# Patient Record
Sex: Female | Born: 1955 | Race: White | Hispanic: No | Marital: Married
Health system: Midwestern US, Academic
[De-identification: ages and names within clinical notes are randomized; demographics above are authoritative.]

---

## 2017-05-27 ENCOUNTER — Encounter: Admit: 2017-05-27 | Discharge: 2017-05-27 | Payer: BC Managed Care – PPO

## 2017-05-27 NOTE — Telephone Encounter
Pt called and lvm that she would like to see if she can schedule an appt for thumb injections.    Called pt and she stated that she would like to know if she can have her right CMC injected and possibly her left as well, but for sure her right.     Notified pt that this RN would check with Dr. Fannie Knee to see if this pt could be scheduled in her injection clinic for [facility] injections.

## 2017-05-28 NOTE — Telephone Encounter
Yes. I recommend right hand 1st CMC injection on one visit and left 1st CMC injection on separate visit, since it is best to have one hand to use during the 48 hours of recovery. Please schedule for 60 minute slot with Ultrasound on next available procedure clinic. If no 60 min slot available, OK to book for 30 minute slot.

## 2017-05-29 NOTE — Telephone Encounter
Called pt and offered appt times for injections.     Pt scheduled for Right CMC on 06/06/17 at 1pm for procedure 60.    Pt scheduled for left CMC on 06/13/17 at 330pm for procedure 60    Pt aware of location.    Pt had no further questions at this time.

## 2017-06-06 ENCOUNTER — Ambulatory Visit: Admit: 2017-06-06 | Discharge: 2017-06-07 | Payer: BC Managed Care – PPO

## 2017-06-06 ENCOUNTER — Encounter: Admit: 2017-06-06 | Discharge: 2017-06-06 | Payer: BC Managed Care – PPO

## 2017-06-06 DIAGNOSIS — E039 Hypothyroidism, unspecified: ICD-10-CM

## 2017-06-06 DIAGNOSIS — M1990 Unspecified osteoarthritis, unspecified site: ICD-10-CM

## 2017-06-06 DIAGNOSIS — M549 Dorsalgia, unspecified: ICD-10-CM

## 2017-06-06 DIAGNOSIS — N6091 Unspecified benign mammary dysplasia of right breast: Principal | ICD-10-CM

## 2017-06-06 MED ORDER — LIDOCAINE/METHYLPREDNISOLONE MIXTURE
Freq: Once | PERIARTICULAR | 0 refills | Status: CP
Start: 2017-06-06 — End: ?
  Administered 2017-06-06 (×2): 0.600 mL via PERIARTICULAR

## 2017-06-06 MED ORDER — LIDOCAINE (PF) 10 MG/ML (1 %) IJ SOLN
.5 mL | Freq: Once | INTRAMUSCULAR | 0 refills | Status: CP
Start: 2017-06-06 — End: ?
  Administered 2017-06-06: 19:00:00 0.5 mL via INTRAMUSCULAR

## 2017-06-07 DIAGNOSIS — M19041 Primary osteoarthritis, right hand: Principal | ICD-10-CM

## 2017-06-07 DIAGNOSIS — M19049 Primary osteoarthritis, unspecified hand: ICD-10-CM

## 2017-06-13 ENCOUNTER — Encounter: Admit: 2017-06-13 | Discharge: 2017-06-13 | Payer: BC Managed Care – PPO

## 2017-06-13 DIAGNOSIS — E039 Hypothyroidism, unspecified: ICD-10-CM

## 2017-06-13 DIAGNOSIS — N6091 Unspecified benign mammary dysplasia of right breast: Principal | ICD-10-CM

## 2017-06-13 DIAGNOSIS — M549 Dorsalgia, unspecified: ICD-10-CM

## 2017-06-13 DIAGNOSIS — M1990 Unspecified osteoarthritis, unspecified site: ICD-10-CM

## 2017-06-14 ENCOUNTER — Ambulatory Visit: Admit: 2017-06-13 | Discharge: 2017-06-14 | Payer: BC Managed Care – PPO

## 2017-06-14 DIAGNOSIS — M19049 Primary osteoarthritis, unspecified hand: ICD-10-CM

## 2017-06-14 DIAGNOSIS — M19042 Primary osteoarthritis, left hand: Principal | ICD-10-CM

## 2017-06-14 MED ORDER — LIDOCAINE (PF) 10 MG/ML (1 %) IJ SOLN
3 mL | Freq: Once | PERIARTICULAR | 0 refills | Status: CP
Start: 2017-06-14 — End: ?
  Administered 2017-06-13: 22:00:00 3 mL via PERIARTICULAR

## 2017-06-14 MED ORDER — LIDOCAINE/METHYLPREDNISOLONE MIXTURE
Freq: Once | PERIARTICULAR | 0 refills | Status: CP
Start: 2017-06-14 — End: ?
  Administered 2017-06-13 (×2): 0.600 mL via PERIARTICULAR

## 2017-08-05 ENCOUNTER — Ambulatory Visit: Admit: 2017-08-05 | Discharge: 2017-08-05 | Payer: BC Managed Care – PPO

## 2017-08-05 DIAGNOSIS — M1810 Unilateral primary osteoarthritis of first carpometacarpal joint, unspecified hand: ICD-10-CM

## 2017-08-05 DIAGNOSIS — M1189 Other specified crystal arthropathies, multiple sites: Principal | ICD-10-CM

## 2017-08-05 NOTE — Progress Notes
Date of Service: 08/05/2017    Subjective:             Miranda Cuevas is a 61 y.o. female.    History of Present Illness  Miranda Cuevas presents today with her husband to discuss her bilateral thumb pain.  She is right-hand dominant.  She has had left thumb base pain for many years and noticed the onset on the right side as well over the past couple of years.  She believes at this point the right side is more problematic.  She has tried splinting and anti-inflammatory medicines with minimal relief.  She also has multiple steroid injections in the past of both sides, which are effective, but have become less effective over time.  She most recently had injections in September and noticed these are already starting to wear off.  No history of trauma to her hands.  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  She is interested in additional treatment options beyond steroid injections.  Her pain is mostly centered around the thumb base and she notices difficulty with opening jars or other gripping type activities.  She is planning on retiring next year and would like this addressed prior to that time.  She does have her daughter's wedding in December and then a busy time of year in January so would prefer to wait until after that timeframe.    Past Medical History:   Diagnosis Date   ??? Arthritis    ??? Atypical ductal hyperplasia of right breast    ??? Back pain    ??? Hypothyroid      Past Surgical History:   Procedure Laterality Date   ??? KNEE SURGERY Left 1995    ARTHROSCOPY    ??? BUNIONECTOMY Left 2000   ??? COLONOSCOPY  2008   ??? THYROIDECTOMY  2012   ??? KNEE REPLACEMENT Right 08/2016   ??? FOOT SURGERY     ??? HX ARTHROSCOPIC SURGERY       Family History   Problem Relation Age of Onset   ??? Hypertension Mother    ??? High Cholesterol Mother    ??? Arthritis-osteo Mother    ??? Hypertension Father    ??? High Cholesterol Father    ??? Cancer Sister    ??? Asthma Sister Social History     Social History   ??? Marital status: Married     Spouse name: N/A   ??? Number of children: N/A   ??? Years of education: N/A     Social History Main Topics   ??? Smoking status: Never Smoker   ??? Smokeless tobacco: Never Used   ??? Alcohol use 1.8 - 3.0 oz/week     3 - 5 Cans of beer per week   ??? Drug use: No   ??? Sexual activity: Not on file     Other Topics Concern   ??? Not on file     Social History Narrative    THIS ENTRY IS PRELIMINARY DOCUMENTATION.                                  Review of Systems   HENT: Positive for tinnitus.    Eyes: Negative.    Respiratory: Negative.    Cardiovascular: Negative.    Gastrointestinal: Negative.    Endocrine: Negative.    Genitourinary: Negative.    Musculoskeletal: Positive  for arthralgias, neck pain and neck stiffness.   Skin: Negative.    Allergic/Immunologic: Negative.    Neurological: Negative.    Hematological: Negative.    Psychiatric/Behavioral: Negative.    All other systems reviewed and are negative.        Objective:         ??? BLACK COHOSH PO Take 40 mg by mouth twice daily.   ??? CALCIUM CARBONATE/VITAMIN D2 (CALCIUM + VITAMIN D PO) Take 2 Tabs by mouth daily.   ??? DOCOSAHEXANOIC ACID/EPA (FISH OIL PO) Take 1,200 mg by mouth daily.   ??? fluoxetine (PROZAC) 20 mg capsule Take 20 mg by mouth daily.   ??? KETOTIFEN FUMARATE (ZADITOR OP) Place  into or around eye(s).   ??? levothyroxine (SYNTHROID) 125 mcg tablet Take 100 mcg by mouth daily.   ??? NAPROXEN SODIUM (ALEVE PO) Take  by mouth daily.   ??? pravastatin (PRAVACHOL) 40 mg tablet Take 40 mg by mouth at bedtime daily.   ??? TRIAMCINOLONE ACETONIDE (NASACORT NA) Apply  into nose as directed daily.     Vitals:    08/05/17 1312   BP: 124/81   Pulse: 58   Weight: 87.5 kg (193 lb)   Height: 170.2 cm (67.01)     Body mass index is 30.22 kg/m???.     Physical Exam   Constitutional: She is oriented to person, place, and time. She appears well-developed and well-nourished.   HENT:   Head: Normocephalic and atraumatic. Eyes: Conjunctivae are normal. No scleral icterus.   Cardiovascular: Intact distal pulses.    Pulmonary/Chest: Effort normal.   Musculoskeletal:   Mild shoulder deformity bilateral thumb base.  Positive grind bilaterally.  10 degrees of MP hyperextension on the left.  None on the right.  Negative Finkelstein's bilaterally.  Mild STT tenderness as well as tenderness of the scaphoid tubercle on the right.  Neurovascular exam normal.   Neurological: She is alert and oriented to person, place, and time.   Skin: Skin is warm. Capillary refill takes less than 2 seconds.   Psychiatric: She has a normal mood and affect. Judgment normal.   Vitals reviewed.    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       Assessment and Plan:  Bilateral thumb basal joint arthritis.  Pathogenesis and treatment options were discussed.  She is largely exhausted nonoperative therapy given her extensive trials with anti-inflammatory both oral and topical as well as immobilization steroid injections.  We discussed the potential for trapeziectomy with tight rope suspension plasty.  The nature of the operation was discussed as well as the anticipated postoperative recovery time.  This point she is leaning toward starting with therapy on the left sometime in late January.  We can reinject her right side at that time as well.  She will contact her clinic to determine how she would like to proceed.  All questions answered.     D. Annabell Howells, MD  Assistant Professor of Plastic and Hand Surgery  [employer] [facility]  [phone]

## 2017-08-09 ENCOUNTER — Encounter: Admit: 2017-08-09 | Discharge: 2017-08-09 | Payer: BC Managed Care – PPO

## 2017-08-09 NOTE — Telephone Encounter
Confirmed sx date of 1/3 with the patient. Pre Op instructions have been sent to her email.

## 2017-08-13 ENCOUNTER — Encounter: Admit: 2017-08-13 | Discharge: 2017-08-13 | Payer: BC Managed Care – PPO

## 2017-08-13 DIAGNOSIS — M1810 Unilateral primary osteoarthritis of first carpometacarpal joint, unspecified hand: Principal | ICD-10-CM

## 2017-08-13 DIAGNOSIS — M1812 Unilateral primary osteoarthritis of first carpometacarpal joint, left hand: Principal | ICD-10-CM

## 2017-08-29 ENCOUNTER — Encounter: Admit: 2017-08-29 | Discharge: 2017-08-29 | Payer: BC Managed Care – PPO

## 2017-08-29 DIAGNOSIS — N644 Mastodynia: ICD-10-CM

## 2017-08-29 DIAGNOSIS — N6091 Unspecified benign mammary dysplasia of right breast: Principal | ICD-10-CM

## 2017-09-03 ENCOUNTER — Ambulatory Visit: Admit: 2017-09-03 | Discharge: 2017-09-04 | Payer: BC Managed Care – PPO

## 2017-09-03 ENCOUNTER — Encounter: Admit: 2017-09-03 | Discharge: 2017-09-03 | Payer: BC Managed Care – PPO

## 2017-09-03 DIAGNOSIS — E039 Hypothyroidism, unspecified: ICD-10-CM

## 2017-09-03 DIAGNOSIS — Z803 Family history of malignant neoplasm of breast: ICD-10-CM

## 2017-09-03 DIAGNOSIS — N644 Mastodynia: Principal | ICD-10-CM

## 2017-09-03 DIAGNOSIS — M1990 Unspecified osteoarthritis, unspecified site: ICD-10-CM

## 2017-09-03 DIAGNOSIS — N6091 Unspecified benign mammary dysplasia of right breast: ICD-10-CM

## 2017-09-03 DIAGNOSIS — M549 Dorsalgia, unspecified: ICD-10-CM

## 2017-09-03 NOTE — Progress Notes
Name: Miranda Cuevas          MRN: [id number]      DOB: 1956/07/10      AGE: 61 y.o.   DATE OF SERVICE: 09/03/2017           Reason for Visit:  Heme/Onc Care      Miranda Cuevas is a 61 y.o. female.     DIAGNOSIS:  Right breast ADH, dx 03/2014     History of Present Illness    Miranda Cuevas presents to clinic today for evaluation of right breast pain.  She reports right lateral chest wall discomfort and fullness that she has noticed in the last several weeks.  She reports that she does not feel any tenderness to touch or see any fullness in the mirror but rather notices it when lowering her right arm.  She reports that her sister was recently diagnosed with breast cancer and has made her more concerned.     HISTORY:  Miranda Cuevas is a Caucasian female who presented to the [facility] on 05/03/2014 at age 35 for evaluation of right breast Atypical ductal hyperplasia. Right stereotactic biopsy 04/08/14 ([place]) revealed Atypical ductal hyperplasia. Miranda Cuevas underwent right RSL lumpectomy on 06/10/14. Final pathology revealed fibrocystic changes with usual ductal hyperplasia. No evidence of malignancy.    BREAST IMAGING:  Mammogram:    -- Bilateral screening mammogram 04/06/14 ([place]) revealed heterogeneously dense breast tissue. There was a new cluster of microcalcifications in the upper outer right breast at approximately 9:30 measuring approximately 8-9 mm. The left breast appeared stable.   -- Right diagnostic mammogram 04/06/14 ([place]) revealed a 10 mm cluster of amorphous calcifications without evidence of associated mass or distortion.       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 No skin, nipple, or areolar change. Right - Post biopsy change at 10:00  REFERRED BY:  Dr. Steva Cuevas     Review of Systems  Constitutional: Negative for fever, chills, appetite change and fatigue.   HENT: Negative for hearing loss, congestion, rhinorrhea and tinnitus.    Eyes: Negative for pain, discharge and itching.   Respiratory: Negative for cough, chest tightness and shortness of breath.    Cardiovascular: Negative for chest pain and palpitations.   Gastrointestinal: Negative for abdominal distention, pain, nausea, vomiting, and diarrhea.   Genitourinary: Negative for frequency, vaginal bleeding, difficulty urinating and pelvic pain.   Musculoskeletal: Negative for myalgias, back pain, joint swelling and arthralgias.   Skin: Negative for rash.   Neurological: Negative for dizziness, weakness, light-headedness and headaches.   Hematological: Does not bruise/bleed easily.   Psychiatric/Behavioral: Negative for disturbed wake/sleep cycle. The patient is not nervous/anxious.     Allergies   Allergen Reactions   ??? Niacin FLUSHING (SKIN)   ??? Pcn [Penicillins] HIVES, RASH and ITCHING     PT STATES STAYED IN HOSPITAL FOR 1 WEEK D/T HIVES; patient tolerates cephalosporins     The following medical/surgical/family/social history and the list of medications are current, as of 09/03/2017    Past Medical  History:   Diagnosis Date   ??? Arthritis    ??? Atypical ductal hyperplasia of right breast    ??? Back pain    ??? Hypothyroid      Past Surgical History:   Procedure Laterality Date   ??? KNEE SURGERY Left 1995    ARTHROSCOPY    ??? BUNIONECTOMY Left 2000   ??? COLONOSCOPY  2008   ??? THYROIDECTOMY  2012   ??? KNEE REPLACEMENT Right 08/2016   ??? FOOT SURGERY     ??? HX ARTHROSCOPIC SURGERY       Family History   Problem Relation Age of Onset   ??? Hypertension Mother    ??? High Cholesterol Mother    ??? Arthritis-osteo Mother    ??? Hypertension Father ??? High Cholesterol Father    ??? Cancer Sister    ??? Asthma Sister      Social History     Social History   ??? Marital status: Married     Spouse name: N/A   ??? Number of children: N/A   ??? Years of education: N/A     Social History Main Topics   ??? Smoking status: Never Smoker   ??? Smokeless tobacco: Never Used   ??? Alcohol use 1.8 - 3.0 oz/week     3 - 5 Cans of beer per week   ??? Drug use: No   ??? Sexual activity: Not on file     Other Topics Concern   ??? Not on file     Social History Narrative    THIS ENTRY IS PRELIMINARY DOCUMENTATION.                           Objective:         ??? BLACK COHOSH PO Take 40 mg by mouth twice daily.   ??? CALCIUM CARBONATE/VITAMIN D2 (CALCIUM + VITAMIN D PO) Take 2 Tabs by mouth daily.   ??? DOCOSAHEXANOIC ACID/EPA (FISH OIL PO) Take 1,200 mg by mouth daily.   ??? fluoxetine (PROZAC) 20 mg capsule Take 20 mg by mouth daily.   ??? KETOTIFEN FUMARATE (ZADITOR OP) Place  into or around eye(s).   ??? levothyroxine (SYNTHROID) 125 mcg tablet Take 100 mcg by mouth daily.   ??? NAPROXEN SODIUM (ALEVE PO) Take  by mouth daily.   ??? pravastatin (PRAVACHOL) 40 mg tablet Take 40 mg by mouth at bedtime daily.   ??? TRIAMCINOLONE ACETONIDE (NASACORT NA) Apply  into nose as directed daily.     Vitals:    09/03/17 0931   BP: 135/79   Pulse: 92   Resp: 18   Temp: 36.5 ???C (97.7 ???F)   TempSrc: Oral   SpO2: 100%   Weight: 87.9 kg (193 lb 12.8 oz)   Height: 170.2 cm (67)     Body mass index is 30.35 kg/m???.             Pain Addressed:  N/A    Patient Evaluated for a Clinical Trial: No treatment clinical trial available for this patient.     [place] Cooperative Oncology Group performance status is 0, Fully active, able to carry on all pre-disease performance without restriction.Marland Kitchen     Physical Exam   Pulmonary/Chest:       Vitals reviewed.       RIGHT BREAST EXAM:  Breast:  No palpable breast masses. No skin, nipple, or areolar change. No asymmetry noted on exam  Skin Erythema:  No  Attachment of Overlying Skin:  No Peau d' orange:  No  Chest Wall Attachment:  No  Nipple Inversion:  No  Nipple Discharge: No    LEFT BREAST EXAM:  Breast: No palpable breast masses. No skin, nipple, or areolar change.  Skin Erythema:  No  Attachment of Overlying Skin:  No  Peau d' orange:  No  Chest Wall Attachment: No  Nipple Inversion:  No  Nipple Discharge:  No    RIGHT NODAL BASIN EXAM:  Axillary:  negative  Infraclavicular:  negative  Supraclavicular:  negative    LEFT NODAL BASIN EXAM:  Axillary:  negative  Infraclavicular: negative  Supraclavicular:  negative      Constitutional: No acute distress.  HEENT:  Head: Normocephalic and atraumatic.  Eyes: No discharge. No scleral icterus.  Pulmonary/Chest: No respiratory distress.   Neurological: Alert and oriented to person, place and time. No cranial nerve deficit.  Skin: Warm and dry. No rash noted. No erythema. No pallor.  Psychiatric: Normal mood and affect. Behavior is normal. Judgement and thought content normal.       Assessment and Plan:  Right breast ADH, dx 03/2014     Miranda Cuevas presents to clinic with her Husband today for evaluation of right lateral breast discomfort.  Miranda Cuevas was reassured that breast pain is a benign and self-limiting condition, the causes of which are not well understood. There is no effective, simple therapy, however some women have had success with trial of herbal supplements such as Fish farm manager or Flax Seed Oil.  In the absence of radiologic and physical exam findings, no further diagnostic intervention is recommended.  I recommended that she consider the Breast Cancer Prevention clinic for further follow up due to her risk factors and sister's new diagnosis of breast cancer.  She was open to this and would like a referral to [facility].  I will call with results of imaging when they are available.  Miranda Cuevas was given ample time to ask questions all of which were answered to her satisfaction. She was encouraged to call with any interval questions or concerns.     1. Mammogram and ultrasound today  2. Referral placed to [facility]    [name], PA-C         ADDENDUM 09/03/2017    Last mammogram was performed 8 months ago.  Reason for exam: clinical finding.  Indicated problem(s): right breast other indicated problem, rt   breast fullness/discomfort.    [id number] MAMMO DIAGNOSTIC RT/TOMO: RIGHT BREAST - September 03, 2017  - ACCESSION #: [id number]  2D/3D Procedure  3D Routine views.  2D Routine views.      Radiologist: Nicholos Johns, M.D.  Technologist: Patrina Levering  Heterogeneous tissue density is present. Patient describes   fullness within the lateral right breast. Postsurgical changes   are present of the right breast. No suspicious findings are   identified.    [id number] US BREAST TARGET RT: RIGHT BREAST - September 03, 2017 -   ACCESSION #: [id number]  Standard views.    Radiologists: Nicholos Johns, M.D.; Elfredia Nevins, M.D.  Technologist: Margaretmary Dys. Banks, Sonographer  Ultrasound imaging of the lateral breast at 8:00 area fullness   demonstrates normal breast tissue. No solid or cystic masses are   present.    By my electronic signature, I attest that I have personally   reviewed the images for this examination and formulated the   interpretations and opinions expressed in this report  Finalized by Nicholos Johns, M.D. on 09/03/2017 11:15 AM.   Dictated by Elfredia Nevins, M.D. on 09/03/2017 10:31 AM.      Electronically signed and approved by: Nicholos Johns, M.D.   [id number]      Impression       ACR BI-RADS??? Assessments: BIRAD 1-Negative (Overall)  Right breast MAMMO DIAG RT/T: BIRAD 1-negative.        RECOMMENDATION:  Routine screening mammogram of both breasts in 6 months.       Discussed results with Miranda Cuevas. I advised that order was placed for her to go to [facility] and that I will notify schedules to cancel her appointment with Dimas Alexandria, PA-C.  She was encouraged to call with any interval questions or concerns.     [name], PA-C

## 2017-09-12 ENCOUNTER — Encounter: Admit: 2017-09-12 | Discharge: 2017-09-12 | Payer: BC Managed Care – PPO

## 2017-09-12 DIAGNOSIS — N6091 Unspecified benign mammary dysplasia of right breast: Principal | ICD-10-CM

## 2017-09-12 DIAGNOSIS — M549 Dorsalgia, unspecified: ICD-10-CM

## 2017-09-12 DIAGNOSIS — E039 Hypothyroidism, unspecified: ICD-10-CM

## 2017-09-12 DIAGNOSIS — M1990 Unspecified osteoarthritis, unspecified site: ICD-10-CM

## 2017-09-12 NOTE — Pre-Anesthesia Patient Instructions
GENERAL INFORMATION    Before you come to the hospital  ??? Make arrangements for a responsible adult to drive you home and stay with you for 24 hours following surgery.  ??? Bath/Shower Instructions  ??? Take a bath or shower with antibacterial soap the night before or the morning of your procedure. Use clean towels.  ??? Put on clean clothes after bath or shower.  Avoid using lotion and oils.  ??? If you are having surgery above the waist, wear a shirt that fastens up the front.  ??? Sleep on clean sheets if bath or shower is done the night before procedure.  ??? Leave money, credit cards, jewelry, and any other valuables at home. The [facility] is not responsible for the loss or breakage of personal items.  ??? Remove nail polish, makeup and all jewelry (including piercings) before coming to the hospital.  ??? The morning of your procedure:  ??? brush your teeth and tongue  ??? do not smoke  ??? do not shave the area where you will have surgery    What to bring to the hospital  ??? ID/ Insurance Card  ??? Medical Device card  ??? Official documents for legal guardianship   ??? Copy of your Living Will, Advanced Directives, and/or Durable Power of Attorney   ??? Small bag with a few personal belongings  ??? Cases for glasses/hearing aids/contact lens (bring solutions for contacts)  ??? Dress in clean, loose, comfortable clothing     Eating or drinking before surgery  ??? Do not eat or drink anything after 11:00 p.m. the day before your procedure (including gum, mints, candy, or chewing tobacco) OR follow the specific instructions you were given by your Surgeon.  ??? You may have WATER ONLY up to 2 hours before arriving at the hospital.  ??? Other instructions: None     Other instructions  Notify your surgeon if:  ??? you become ill with a cough, fever, sore throat, nausea, vomiting or flu-like symptoms  ??? you have any open wounds/sores that are red, painful, draining, or are new since you last saw  the doctor ??? you need to cancel your procedure  ??? You will receive a call with your surgery arrival time from between 2:30pm and 4:30pm the last business day before your procedure.  If you do not receive a call, please call [phone] before 4:30pm or [phone] after 4:30pm.    Notify us at [facility]: [phone]  ??? if you need to cancel your procedure  ??? if you are going to be late    Arrival at the hospital    [facility]  [address]  [place], [place] [address]    ??? Park in the [employer], located directly across from the main entrance to the hospital.  ??? Valet parking is available  from 7 AM to 4 PM Monday through Friday.  ??? Enter through the ground floor main hospital entrance and check in at the Information Desk in the lobby.  ??? They will validate your parking ticket and direct you to the next location.  ??? If you are a woman between the ages of 38 and 59, and have not had a hysterectomy, you will be asked for a urine sample prior to surgery.  Please do not urinate before arriving in the Surgery Waiting Room.  Once there, check in and let the attendant know if you need to provide a sample.

## 2017-09-12 NOTE — Progress Notes
[facility] with patient for surgery on 1/3 w/ Dr Annabell HowellsEndress. Medications, allergies and PMH reviewed. Pt reports that a doctor had heard a heart murmur over 25 years ago and she had testing and was told that everything was fine. Pt reports since that time no one has reported that they have heard heart murmur. Patient denies CP and SOB. Able to climb 2 flights of stairs w/out CP or SOB. Patient uses elliptical for 20-25 minutes 3-4x per week. PAC appointment not indicated at this time. Pre op and med instructions given. NPO p 11 pm the night before sx, okay to drink water until 2 hrs prior to arrival. Hold black cohosh and fish oil 14 days prior, NSAIDS 7 days prior. Do not take calcium/vit D2 on DOS. Okay to take Prozac, Zaditor, Synthroid, Pravastatin, Nasacort and Prilosec. Pt verbalizes understanding, instructions sent to patient.

## 2017-09-12 NOTE — Pre-Anesthesia Medication Instructions
YOUR MEDICATIONS:     BLACK COHOSH PO Take 40 mg by mouth daily.    CALCIUM CARBONATE/VITAMIN D2 (CALCIUM + VITAMIN D PO) Take 2 Tabs by mouth daily.    DOCOSAHEXANOIC ACID/EPA (FISH OIL PO) Take 1,200 mg by mouth daily.    fluoxetine (PROZAC) 20 mg capsule Take 20 mg by mouth daily.    KETOTIFEN FUMARATE (ZADITOR OP) Place  into or around eye(s).    levothyroxine (SYNTHROID) 125 mcg tablet Take 100 mcg by mouth daily.    NAPROXEN SODIUM (ALEVE PO) Take  by mouth daily.    omeprazole (PRILOSEC PO) Take  by mouth.    pravastatin (PRAVACHOL) 40 mg tablet Take 40 mg by mouth at bedtime daily.    TRIAMCINOLONE ACETONIDE (NASACORT NA) Apply  into nose as directed daily.          YOUR  MEDICATION INSTRUCTIONS FOR SURGERY:    Before surgery  Stop the following vitamins, herbals, and natural supplements 14 days before surgery:   Black Cohosh   Fish oil    Stop the following medications 7 days before surgery:   Anti-inflammatory medications such as ibuprofen (Advil, Motrin) and naproxen (Aleve)   You may use acetaminophen (Tylenol)       Morning of surgery  On the morning of surgery, do NOT take these medications:   Remaining vitamins/supplements   Ointments/creams/lotions   Calcium Vit D 2    On the morning of surgery, take ONLY these medications with a sip (1-2 ounces) of water:   Prozac   Zaditor   Synthroid   Pravastatin   Nasacort   Prilosec      Other information  Before surgery, please contact Sarenity Ramaker with any medicine updates or questions.   E-mail:  [email or web address]   Phone:  [phone]    Before going home from the hospital, please ask your doctor when you should re-start your medicines that were stopped before surgery.

## 2017-10-02 ENCOUNTER — Ambulatory Visit: Admit: 2017-10-02 | Discharge: 2017-10-02 | Payer: BC Managed Care – PPO

## 2017-10-02 DIAGNOSIS — M1812 Unilateral primary osteoarthritis of first carpometacarpal joint, left hand: Principal | ICD-10-CM

## 2017-10-03 ENCOUNTER — Encounter: Admit: 2017-10-03 | Discharge: 2017-10-03 | Payer: BC Managed Care – PPO

## 2017-10-03 ENCOUNTER — Inpatient Hospital Stay: Admit: 2017-10-03 | Discharge: 2017-10-03 | Payer: BC Managed Care – PPO

## 2017-10-03 ENCOUNTER — Ambulatory Visit: Admit: 2017-10-03 | Discharge: 2017-10-03 | Payer: BC Managed Care – PPO

## 2017-10-03 DIAGNOSIS — Z96651 Presence of right artificial knee joint: ICD-10-CM

## 2017-10-03 DIAGNOSIS — M1812 Unilateral primary osteoarthritis of first carpometacarpal joint, left hand: Principal | ICD-10-CM

## 2017-10-03 DIAGNOSIS — M1810 Unilateral primary osteoarthritis of first carpometacarpal joint, unspecified hand: ICD-10-CM

## 2017-10-03 DIAGNOSIS — M25742 Osteophyte, left hand: ICD-10-CM

## 2017-10-03 MED ORDER — DIPHENHYDRAMINE HCL 50 MG/ML IJ SOLN
25 mg | Freq: Once | INTRAVENOUS | 0 refills | Status: DC | PRN
Start: 2017-10-03 — End: 2017-10-03

## 2017-10-03 MED ORDER — FENTANYL CITRATE (PF) 50 MCG/ML IJ SOLN
0 refills | Status: DC
  Administered 2017-10-03: 13:00:00 100 ug via INTRAVENOUS

## 2017-10-03 MED ORDER — FENTANYL CITRATE (PF) 50 MCG/ML IJ SOLN
25 ug | INTRAVENOUS | 0 refills | Status: DC | PRN
Start: 2017-10-03 — End: 2017-10-03

## 2017-10-03 MED ORDER — FENTANYL CITRATE (PF) 50 MCG/ML IJ SOLN
25-50 ug | INTRAVENOUS | 0 refills | Status: DC | PRN
Start: 2017-10-03 — End: 2017-10-03

## 2017-10-03 MED ORDER — ROPIVACAINE 0.5% 15ML/MEPIVACAINE 1% 15ML INJECTION (OSM)(AN)
0 refills | Status: DC
  Administered 2017-10-03 (×2): 30 mL

## 2017-10-03 MED ORDER — METOCLOPRAMIDE HCL 5 MG/ML IJ SOLN
0 refills | Status: DC
Start: 2017-10-03 — End: 2017-10-03
  Administered 2017-10-03: 13:00:00 10 mg via INTRAVENOUS

## 2017-10-03 MED ORDER — LIDOCAINE (PF) 10 MG/ML (1 %) IJ SOLN
.1-2 mL | INTRAMUSCULAR | 0 refills | Status: DC | PRN
Start: 2017-10-03 — End: 2017-10-03

## 2017-10-03 MED ORDER — LACTATED RINGERS IV SOLP
INTRAVENOUS | 0 refills | Status: DC
Start: 2017-10-03 — End: 2017-10-03
  Administered 2017-10-03: 13:00:00 1000.000 mL via INTRAVENOUS

## 2017-10-03 MED ORDER — SODIUM CITRATE-CITRIC ACID 500-334 MG/5 ML PO SOLN
0 refills | Status: DC
Start: 2017-10-03 — End: 2017-10-03
  Administered 2017-10-03: 14:00:00 30 mL via ORAL

## 2017-10-03 MED ORDER — METOCLOPRAMIDE HCL 5 MG/ML IJ SOLN
10 mg | Freq: Once | INTRAVENOUS | 0 refills | Status: DC | PRN
Start: 2017-10-03 — End: 2017-10-03

## 2017-10-03 MED ORDER — HYDROMORPHONE (PF) 2 MG/ML IJ SYRG
.5-1 mg | INTRAVENOUS | 0 refills | Status: DC | PRN
Start: 2017-10-03 — End: 2017-10-03

## 2017-10-03 MED ORDER — FAMOTIDINE (PF) 20 MG/2 ML IV SOLN
0 refills | Status: DC
Start: 2017-10-03 — End: 2017-10-03
  Administered 2017-10-03: 13:00:00 20 mg via INTRAVENOUS

## 2017-10-03 MED ORDER — PROMETHAZINE 25 MG/ML IJ SOLN
6.25 mg | INTRAVENOUS | 0 refills | Status: DC | PRN
Start: 2017-10-03 — End: 2017-10-03

## 2017-10-03 MED ORDER — FENTANYL CITRATE (PF) 50 MCG/ML IJ SOLN
50 ug | INTRAVENOUS | 0 refills | Status: DC | PRN
Start: 2017-10-03 — End: 2017-10-03

## 2017-10-03 MED ORDER — MIDAZOLAM 1 MG/ML IJ SOLN
INTRAVENOUS | 0 refills | Status: DC
  Administered 2017-10-03: 13:00:00 2 mg via INTRAVENOUS
  Administered 2017-10-03: 14:00:00 1 mg via INTRAVENOUS

## 2017-10-03 MED ORDER — HALOPERIDOL LACTATE 5 MG/ML IJ SOLN
1 mg | Freq: Once | INTRAVENOUS | 0 refills | Status: DC | PRN
Start: 2017-10-03 — End: 2017-10-03

## 2017-10-03 MED ORDER — OXYCODONE 5 MG PO TAB
5 mg | ORAL_TABLET | ORAL | 0 refills | 6.00000 days | Status: AC | PRN
Start: 2017-10-03 — End: 2017-10-28

## 2017-10-03 MED ORDER — SENNOSIDES-DOCUSATE SODIUM 8.6-50 MG PO TAB
1 | ORAL_TABLET | Freq: Every day | ORAL | 0 refills | Status: AC
Start: 2017-10-03 — End: 2017-10-28

## 2017-10-03 MED ORDER — OXYCODONE 5 MG PO TAB
5-10 mg | Freq: Once | ORAL | 0 refills | Status: DC | PRN
Start: 2017-10-03 — End: 2017-10-03

## 2017-10-03 MED ORDER — ASCORBIC ACID (VITAMIN C) 250 MG PO TAB
250 mg | ORAL_TABLET | Freq: Every day | ORAL | 0 refills | 50.00000 days | Status: AC
Start: 2017-10-03 — End: 2017-10-28

## 2017-10-03 NOTE — Progress Notes
Discharge instructions reviewed with patient and patient's husband at bedside. No further questions at this time.

## 2017-10-07 ENCOUNTER — Encounter: Admit: 2017-10-07 | Discharge: 2017-10-07 | Payer: BC Managed Care – PPO

## 2017-10-07 DIAGNOSIS — N6091 Unspecified benign mammary dysplasia of right breast: Principal | ICD-10-CM

## 2017-10-07 DIAGNOSIS — E039 Hypothyroidism, unspecified: ICD-10-CM

## 2017-10-07 DIAGNOSIS — M549 Dorsalgia, unspecified: ICD-10-CM

## 2017-10-07 DIAGNOSIS — M1990 Unspecified osteoarthritis, unspecified site: ICD-10-CM

## 2017-10-15 ENCOUNTER — Encounter: Admit: 2017-10-15 | Discharge: 2017-10-31 | Payer: BC Managed Care – PPO

## 2017-10-15 ENCOUNTER — Ambulatory Visit: Admit: 2017-10-15 | Discharge: 2017-10-15 | Payer: BC Managed Care – PPO

## 2017-10-15 DIAGNOSIS — M1810 Unilateral primary osteoarthritis of first carpometacarpal joint, unspecified hand: Principal | ICD-10-CM

## 2017-10-16 ENCOUNTER — Encounter: Admit: 2017-10-16 | Discharge: 2017-10-16 | Payer: BC Managed Care – PPO

## 2017-10-16 DIAGNOSIS — M1810 Unilateral primary osteoarthritis of first carpometacarpal joint, unspecified hand: Principal | ICD-10-CM

## 2017-10-16 DIAGNOSIS — N6091 Unspecified benign mammary dysplasia of right breast: Principal | ICD-10-CM

## 2017-10-16 DIAGNOSIS — M1990 Unspecified osteoarthritis, unspecified site: ICD-10-CM

## 2017-10-16 DIAGNOSIS — E039 Hypothyroidism, unspecified: ICD-10-CM

## 2017-10-16 DIAGNOSIS — M549 Dorsalgia, unspecified: ICD-10-CM

## 2017-10-17 ENCOUNTER — Encounter: Admit: 2017-10-17 | Discharge: 2017-10-17 | Payer: BC Managed Care – PPO

## 2017-10-28 ENCOUNTER — Encounter: Admit: 2017-10-28 | Discharge: 2017-10-28 | Payer: BC Managed Care – PPO

## 2017-10-28 DIAGNOSIS — Z1239 Encounter for other screening for malignant neoplasm of breast: ICD-10-CM

## 2017-10-28 DIAGNOSIS — N6091 Unspecified benign mammary dysplasia of right breast: Principal | ICD-10-CM

## 2017-10-28 DIAGNOSIS — Z780 Asymptomatic menopausal state: Secondary | ICD-10-CM

## 2017-10-28 DIAGNOSIS — Z8262 Family history of osteoporosis: Secondary | ICD-10-CM

## 2017-10-28 DIAGNOSIS — E039 Hypothyroidism, unspecified: ICD-10-CM

## 2017-10-28 DIAGNOSIS — Z1231 Encounter for screening mammogram for malignant neoplasm of breast: ICD-10-CM

## 2017-10-28 DIAGNOSIS — M1990 Unspecified osteoarthritis, unspecified site: ICD-10-CM

## 2017-10-28 DIAGNOSIS — M549 Dorsalgia, unspecified: ICD-10-CM

## 2017-10-28 DIAGNOSIS — Z803 Family history of malignant neoplasm of breast: ICD-10-CM

## 2017-10-28 DIAGNOSIS — E7800 Pure hypercholesterolemia, unspecified: ICD-10-CM

## 2017-10-28 DIAGNOSIS — E669 Obesity, unspecified: ICD-10-CM

## 2017-10-28 DIAGNOSIS — Z9189 Other specified personal risk factors, not elsewhere classified: ICD-10-CM

## 2017-10-28 DIAGNOSIS — Z13820 Encounter for screening for osteoporosis: ICD-10-CM

## 2017-10-29 ENCOUNTER — Ambulatory Visit: Admit: 2017-10-29 | Discharge: 2017-10-29 | Payer: BC Managed Care – PPO

## 2017-10-29 ENCOUNTER — Encounter: Admit: 2017-10-29 | Discharge: 2017-10-29 | Payer: BC Managed Care – PPO

## 2017-10-29 DIAGNOSIS — M1810 Unilateral primary osteoarthritis of first carpometacarpal joint, unspecified hand: Principal | ICD-10-CM

## 2017-10-29 DIAGNOSIS — M549 Dorsalgia, unspecified: ICD-10-CM

## 2017-10-29 DIAGNOSIS — N6091 Unspecified benign mammary dysplasia of right breast: Principal | ICD-10-CM

## 2017-10-29 DIAGNOSIS — M1990 Unspecified osteoarthritis, unspecified site: ICD-10-CM

## 2017-10-29 DIAGNOSIS — E039 Hypothyroidism, unspecified: ICD-10-CM

## 2017-10-30 ENCOUNTER — Encounter: Admit: 2017-10-30 | Discharge: 2017-10-30 | Payer: BC Managed Care – PPO

## 2017-10-30 DIAGNOSIS — E039 Hypothyroidism, unspecified: ICD-10-CM

## 2017-10-30 DIAGNOSIS — M1990 Unspecified osteoarthritis, unspecified site: ICD-10-CM

## 2017-10-30 DIAGNOSIS — M549 Dorsalgia, unspecified: ICD-10-CM

## 2017-10-30 DIAGNOSIS — N6091 Unspecified benign mammary dysplasia of right breast: Principal | ICD-10-CM

## 2017-10-31 DIAGNOSIS — M1810 Unilateral primary osteoarthritis of first carpometacarpal joint, unspecified hand: Principal | ICD-10-CM

## 2017-11-25 ENCOUNTER — Encounter: Admit: 2017-11-25 | Discharge: 2017-11-25 | Payer: BC Managed Care – PPO

## 2018-01-01 ENCOUNTER — Ambulatory Visit: Admit: 2018-01-01 | Discharge: 2018-01-01 | Payer: BC Managed Care – PPO

## 2018-01-02 ENCOUNTER — Ambulatory Visit: Admit: 2018-01-02 | Discharge: 2018-01-03 | Payer: BC Managed Care – PPO

## 2018-01-02 ENCOUNTER — Ambulatory Visit: Admit: 2018-01-02 | Discharge: 2018-01-02 | Payer: BC Managed Care – PPO

## 2018-01-02 ENCOUNTER — Encounter: Admit: 2018-01-02 | Discharge: 2018-01-02 | Payer: BC Managed Care – PPO

## 2018-01-02 DIAGNOSIS — E039 Hypothyroidism, unspecified: ICD-10-CM

## 2018-01-02 DIAGNOSIS — N6091 Unspecified benign mammary dysplasia of right breast: Principal | ICD-10-CM

## 2018-01-02 DIAGNOSIS — M1990 Unspecified osteoarthritis, unspecified site: ICD-10-CM

## 2018-01-02 DIAGNOSIS — M549 Dorsalgia, unspecified: ICD-10-CM

## 2018-01-02 DIAGNOSIS — M19041 Primary osteoarthritis, right hand: Secondary | ICD-10-CM

## 2018-01-02 MED ORDER — BUPIVACAINE 0.25 % (2.5 MG/ML) IJ SOLN
0 refills | Status: DC
Start: 2018-01-02 — End: 2018-01-02

## 2018-01-02 MED ORDER — PROPOFOL INJ 10 MG/ML IV VIAL
0 refills | Status: DC
Start: 2018-01-02 — End: 2018-01-02
  Administered 2018-01-02: 13:00:00 150 mg via INTRAVENOUS
  Administered 2018-01-02: 13:00:00 70 mg via INTRAVENOUS
  Administered 2018-01-02: 13:00:00 50 mg via INTRAVENOUS
  Administered 2018-01-02: 14:00:00 10 mg via INTRAVENOUS

## 2018-01-02 MED ORDER — PHENYLEPHRINE IN 0.9% NACL(PF) 1 MG/10 ML (100 MCG/ML) IV SYRG
INTRAVENOUS | 0 refills | Status: DC
Start: 2018-01-02 — End: 2018-01-02
  Administered 2018-01-02 (×3): 50 ug via INTRAVENOUS
  Administered 2018-01-02: 14:00:00 100 ug via INTRAVENOUS

## 2018-01-02 MED ORDER — DEXTRAN 70-HYPROMELLOSE (PF) 0.1-0.3 % OP DPET
0 refills | Status: DC
Start: 2018-01-02 — End: 2018-01-02
  Administered 2018-01-02: 13:00:00 2 [drp] via OPHTHALMIC

## 2018-01-02 MED ORDER — MIDAZOLAM 1 MG/ML IJ SOLN
INTRAVENOUS | 0 refills | Status: DC
Start: 2018-01-02 — End: 2018-01-02
  Administered 2018-01-02: 13:00:00 2 mg via INTRAVENOUS

## 2018-01-02 MED ORDER — LIDOCAINE (PF) 200 MG/10 ML (2 %) IJ SYRG
0 refills | Status: DC
Start: 2018-01-02 — End: 2018-01-02
  Administered 2018-01-02: 13:00:00 100 mg via INTRAVENOUS

## 2018-01-02 MED ORDER — DIPHENHYDRAMINE HCL 50 MG/ML IJ SOLN
0 refills | Status: DC
Start: 2018-01-02 — End: 2018-01-02
  Administered 2018-01-02: 13:00:00 25 mg via INTRAVENOUS

## 2018-01-02 MED ORDER — ONDANSETRON HCL (PF) 4 MG/2 ML IJ SOLN
INTRAVENOUS | 0 refills | Status: DC
Start: 2018-01-02 — End: 2018-01-02

## 2018-01-02 MED ORDER — FENTANYL CITRATE (PF) 50 MCG/ML IJ SOLN
50 ug | INTRAVENOUS | 0 refills | Status: DC | PRN
Start: 2018-01-02 — End: 2018-01-02

## 2018-01-02 MED ORDER — BUPIVACAINE-EPINEPHRINE (PF) 0.25 %-1:200,000 IJ SOLN
0 refills | Status: DC
Start: 2018-01-02 — End: 2018-01-02
  Administered 2018-01-02: 14:00:00 16 mL via INTRAMUSCULAR

## 2018-01-02 MED ORDER — ONDANSETRON HCL (PF) 4 MG/2 ML IJ SOLN
0 refills | Status: DC
Start: 2018-01-02 — End: 2018-01-02
  Administered 2018-01-02: 14:00:00 4 mg via INTRAVENOUS

## 2018-01-02 MED ORDER — LACTATED RINGERS IV SOLP
1000 mL | INTRAVENOUS | 0 refills | Status: DC
Start: 2018-01-02 — End: 2018-01-02
  Administered 2018-01-02 (×2): 1000.000 mL via INTRAVENOUS

## 2018-01-02 MED ORDER — DOXYCYCLINE HYCLATE 100 MG PO CAP
100 mg | ORAL_CAPSULE | Freq: Two times a day (BID) | ORAL | 0 refills | 8.00000 days | Status: AC
Start: 2018-01-02 — End: ?

## 2018-01-02 MED ORDER — DEXAMETHASONE SODIUM PHOSPHATE 4 MG/ML IJ SOLN
INTRAVENOUS | 0 refills | Status: DC
Start: 2018-01-02 — End: 2018-01-02
  Administered 2018-01-02: 13:00:00 4 mg via INTRAVENOUS

## 2018-01-02 MED ORDER — FENTANYL CITRATE (PF) 50 MCG/ML IJ SOLN
0 refills | Status: DC
Start: 2018-01-02 — End: 2018-01-02
  Administered 2018-01-02: 13:00:00 50 ug via INTRAVENOUS

## 2018-01-02 MED ORDER — OXYCODONE 5 MG PO TAB
5-10 mg | Freq: Once | ORAL | 0 refills | Status: CP | PRN
Start: 2018-01-02 — End: ?
  Administered 2018-01-02: 15:00:00 10 mg via ORAL

## 2018-01-02 MED ORDER — CEFAZOLIN 1 GRAM IJ SOLR
0 refills | Status: DC
Start: 2018-01-02 — End: 2018-01-02
  Administered 2018-01-02: 13:00:00 2 g via INTRAVENOUS

## 2018-01-02 MED ORDER — FENTANYL CITRATE (PF) 50 MCG/ML IJ SOLN
25 ug | INTRAVENOUS | 0 refills | Status: DC | PRN
Start: 2018-01-02 — End: 2018-01-02

## 2018-01-02 MED ORDER — SUCCINYLCHOLINE CHLORIDE 20 MG/ML IJ SOLN
INTRAVENOUS | 0 refills | Status: DC
Start: 2018-01-02 — End: 2018-01-02
  Administered 2018-01-02: 13:00:00 120 mg via INTRAVENOUS

## 2018-01-02 MED ORDER — LACTATED RINGERS IV SOLP
INTRAVENOUS | 0 refills | Status: DC
Start: 2018-01-02 — End: 2018-01-02

## 2018-01-02 MED ORDER — EPHEDRINE SULFATE 50 MG/5ML SYR (10 MG/ML) (AN)(OSM)
0 refills | Status: DC
Start: 2018-01-02 — End: 2018-01-02
  Administered 2018-01-02 (×2): 10 mg via INTRAVENOUS

## 2018-01-06 ENCOUNTER — Encounter: Admit: 2018-01-06 | Discharge: 2018-01-06 | Payer: BC Managed Care – PPO

## 2018-01-06 DIAGNOSIS — M1990 Unspecified osteoarthritis, unspecified site: ICD-10-CM

## 2018-01-06 DIAGNOSIS — M549 Dorsalgia, unspecified: ICD-10-CM

## 2018-01-06 DIAGNOSIS — N6091 Unspecified benign mammary dysplasia of right breast: Principal | ICD-10-CM

## 2018-01-06 DIAGNOSIS — E039 Hypothyroidism, unspecified: ICD-10-CM

## 2018-01-14 ENCOUNTER — Encounter: Admit: 2018-01-14 | Discharge: 2018-01-14 | Payer: BC Managed Care – PPO

## 2018-01-14 ENCOUNTER — Encounter: Admit: 2018-01-14 | Discharge: 2018-01-28 | Payer: BC Managed Care – PPO

## 2018-01-14 ENCOUNTER — Ambulatory Visit: Admit: 2018-01-14 | Discharge: 2018-01-14 | Payer: BC Managed Care – PPO

## 2018-01-14 ENCOUNTER — Encounter: Admit: 2018-01-14 | Discharge: 2018-01-15 | Payer: BC Managed Care – PPO

## 2018-01-14 DIAGNOSIS — M549 Dorsalgia, unspecified: ICD-10-CM

## 2018-01-14 DIAGNOSIS — M1810 Unilateral primary osteoarthritis of first carpometacarpal joint, unspecified hand: Principal | ICD-10-CM

## 2018-01-14 DIAGNOSIS — M1990 Unspecified osteoarthritis, unspecified site: ICD-10-CM

## 2018-01-14 DIAGNOSIS — E039 Hypothyroidism, unspecified: ICD-10-CM

## 2018-01-14 DIAGNOSIS — N6091 Unspecified benign mammary dysplasia of right breast: Principal | ICD-10-CM

## 2018-01-14 DIAGNOSIS — Z1239 Encounter for other screening for malignant neoplasm of breast: ICD-10-CM

## 2018-01-14 MED ORDER — RALOXIFENE 60 MG PO TAB
60 mg | ORAL_TABLET | Freq: Every day | ORAL | 0 refills | 90.00000 days | Status: AC
Start: 2018-01-14 — End: 2018-02-11

## 2018-01-28 ENCOUNTER — Encounter: Admit: 2018-01-14 | Discharge: 2018-01-14 | Payer: BC Managed Care – PPO

## 2018-01-28 DIAGNOSIS — Z1231 Encounter for screening mammogram for malignant neoplasm of breast: ICD-10-CM

## 2018-01-28 DIAGNOSIS — E669 Obesity, unspecified: ICD-10-CM

## 2018-01-28 DIAGNOSIS — N6091 Unspecified benign mammary dysplasia of right breast: ICD-10-CM

## 2018-01-28 DIAGNOSIS — Z79810 Long term (current) use of selective estrogen receptor modulators (SERMs): ICD-10-CM

## 2018-01-28 DIAGNOSIS — Z9189 Other specified personal risk factors, not elsewhere classified: Secondary | ICD-10-CM

## 2018-01-28 DIAGNOSIS — Z803 Family history of malignant neoplasm of breast: ICD-10-CM

## 2018-01-28 DIAGNOSIS — M1810 Unilateral primary osteoarthritis of first carpometacarpal joint, unspecified hand: Principal | ICD-10-CM

## 2018-01-29 ENCOUNTER — Encounter: Admit: 2018-01-29 | Discharge: 2018-02-28 | Payer: BC Managed Care – PPO

## 2018-02-11 ENCOUNTER — Encounter: Admit: 2018-02-11 | Discharge: 2018-02-11 | Payer: BC Managed Care – PPO

## 2018-02-11 MED ORDER — RALOXIFENE 60 MG PO TAB
60 mg | ORAL_TABLET | Freq: Every day | ORAL | 3 refills | 90.00000 days | Status: AC
Start: 2018-02-11 — End: 2019-03-26

## 2018-02-25 ENCOUNTER — Ambulatory Visit: Admit: 2018-02-25 | Discharge: 2018-02-25 | Payer: BC Managed Care – PPO

## 2018-02-25 DIAGNOSIS — M1810 Unilateral primary osteoarthritis of first carpometacarpal joint, unspecified hand: Principal | ICD-10-CM

## 2018-02-26 ENCOUNTER — Encounter: Admit: 2018-02-26 | Discharge: 2018-02-26 | Payer: BC Managed Care – PPO

## 2018-02-26 DIAGNOSIS — E039 Hypothyroidism, unspecified: ICD-10-CM

## 2018-02-26 DIAGNOSIS — M1990 Unspecified osteoarthritis, unspecified site: ICD-10-CM

## 2018-02-26 DIAGNOSIS — M549 Dorsalgia, unspecified: ICD-10-CM

## 2018-02-26 DIAGNOSIS — N6091 Unspecified benign mammary dysplasia of right breast: Principal | ICD-10-CM

## 2018-02-26 DIAGNOSIS — M1810 Unilateral primary osteoarthritis of first carpometacarpal joint, unspecified hand: ICD-10-CM

## 2018-02-28 DIAGNOSIS — M1810 Unilateral primary osteoarthritis of first carpometacarpal joint, unspecified hand: Principal | ICD-10-CM

## 2018-04-09 ENCOUNTER — Encounter: Admit: 2018-04-09 | Discharge: 2018-04-09 | Payer: BC Managed Care – PPO

## 2018-04-09 DIAGNOSIS — M1810 Unilateral primary osteoarthritis of first carpometacarpal joint, unspecified hand: Principal | ICD-10-CM

## 2018-07-18 ENCOUNTER — Encounter: Admit: 2018-07-18 | Discharge: 2018-07-18 | Payer: BC Managed Care – PPO

## 2018-07-18 DIAGNOSIS — N6091 Unspecified benign mammary dysplasia of right breast: ICD-10-CM

## 2018-07-18 DIAGNOSIS — Z780 Asymptomatic menopausal state: ICD-10-CM

## 2018-07-18 DIAGNOSIS — Z79810 Long term (current) use of selective estrogen receptor modulators (SERMs): ICD-10-CM

## 2018-07-18 DIAGNOSIS — E039 Hypothyroidism, unspecified: ICD-10-CM

## 2018-07-18 DIAGNOSIS — K7689 Other specified diseases of liver: ICD-10-CM

## 2018-07-18 DIAGNOSIS — Z9189 Other specified personal risk factors, not elsewhere classified: ICD-10-CM

## 2018-07-18 DIAGNOSIS — E785 Hyperlipidemia, unspecified: ICD-10-CM

## 2018-07-18 DIAGNOSIS — Z803 Family history of malignant neoplasm of breast: ICD-10-CM

## 2018-07-18 DIAGNOSIS — M1810 Unilateral primary osteoarthritis of first carpometacarpal joint, unspecified hand: ICD-10-CM

## 2018-07-18 DIAGNOSIS — I10 Essential (primary) hypertension: ICD-10-CM

## 2018-07-18 DIAGNOSIS — M1990 Unspecified osteoarthritis, unspecified site: ICD-10-CM

## 2018-07-18 DIAGNOSIS — Z1239 Encounter for other screening for malignant neoplasm of breast: Principal | ICD-10-CM

## 2018-07-18 DIAGNOSIS — M549 Dorsalgia, unspecified: ICD-10-CM

## 2018-07-18 MED ORDER — GADOBENATE DIMEGLUMINE 529 MG/ML (0.1MMOL/0.2ML) IV SOLN
18 mL | Freq: Once | INTRAVENOUS | 0 refills | Status: CP
Start: 2018-07-18 — End: ?
  Administered 2018-07-18: 18:00:00 18 mL via INTRAVENOUS

## 2018-07-18 MED ORDER — AZITHROMYCIN 250 MG PO TAB
ORAL_TABLET | Freq: Every day | ORAL | 0 refills | Status: AC
Start: 2018-07-18 — End: ?

## 2018-07-18 MED ORDER — SODIUM CHLORIDE 0.9 % IJ SOLN
50 mL | Freq: Once | INTRAVENOUS | 0 refills | Status: CP
Start: 2018-07-18 — End: ?
  Administered 2018-07-18: 18:00:00 50 mL via INTRAVENOUS

## 2018-08-20 IMAGING — CR LOW_EXM
3 series · 3 of 3 positions shown · non-contrast
Comparison: none

[foot]
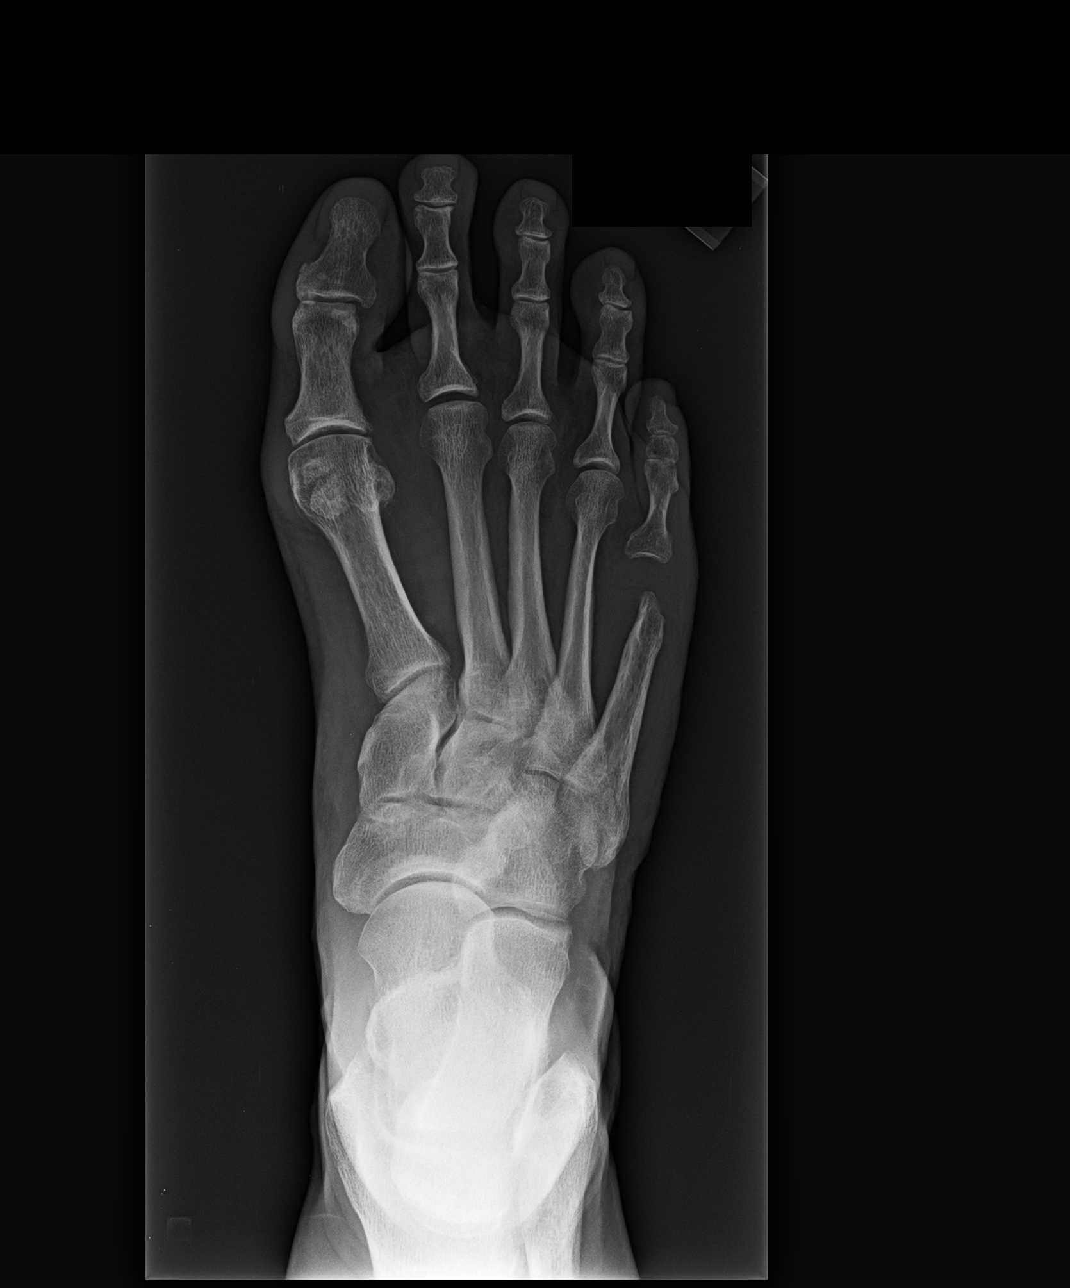

[foot obl]
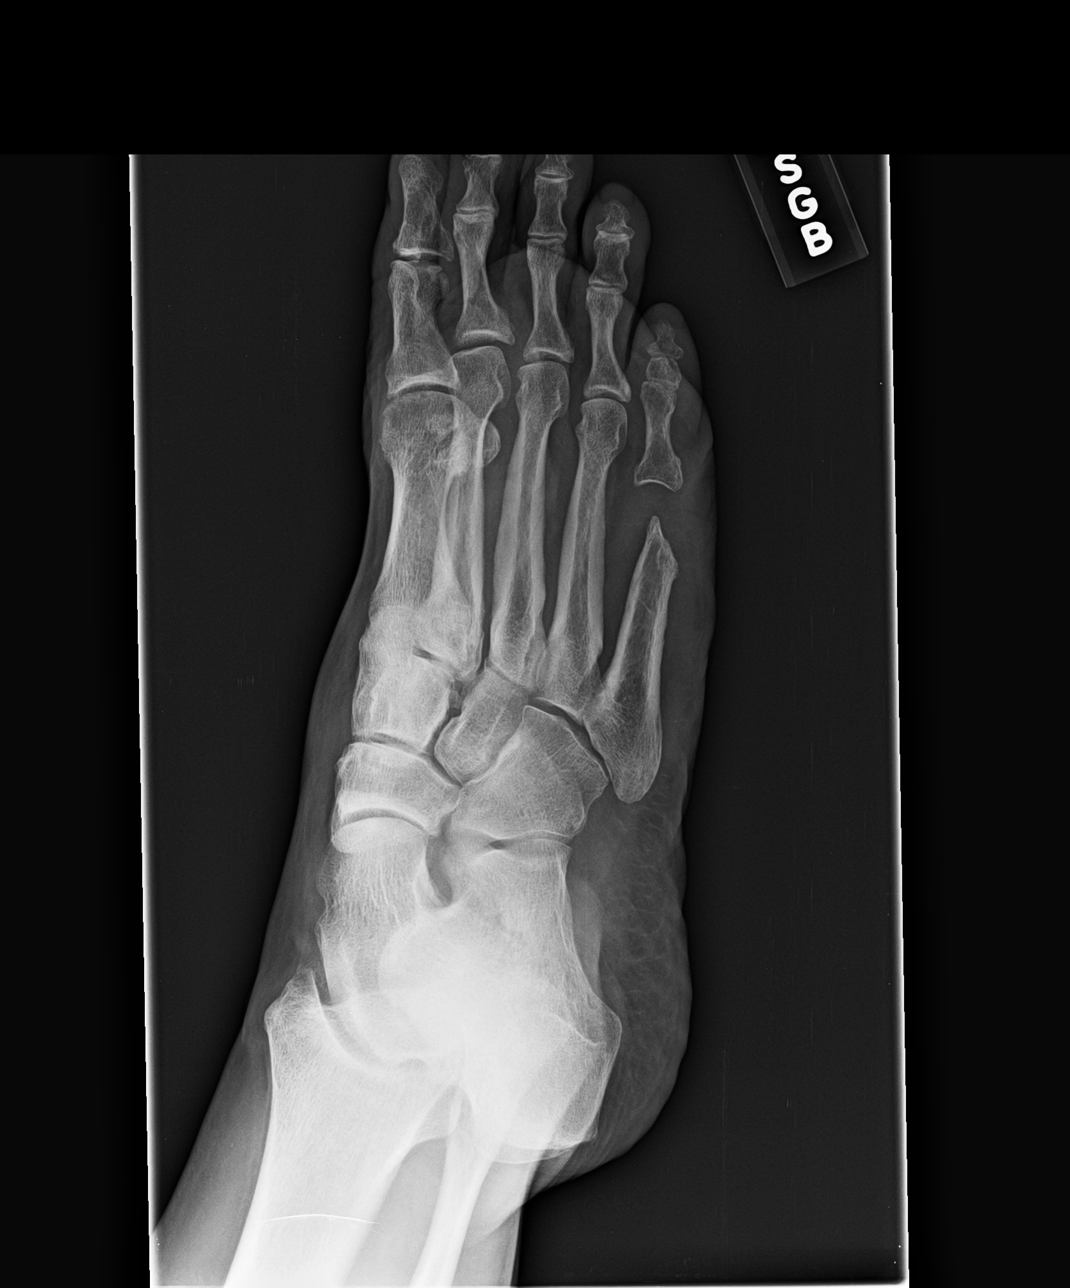

[foot lat]
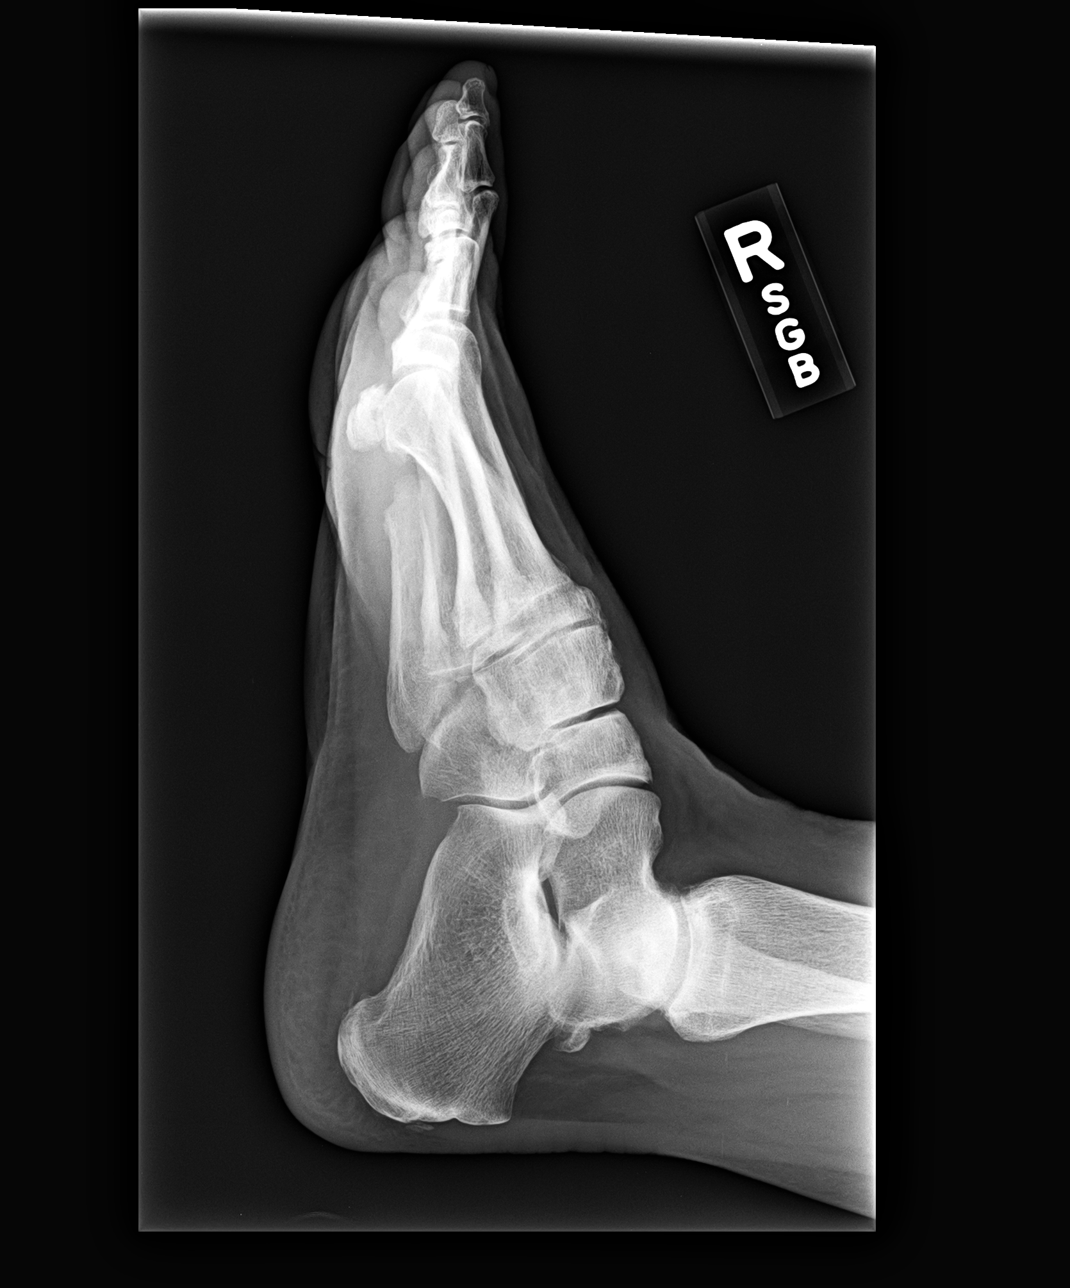

[3 of 3 positions shown; findings below may reference images not displayed]

EXAM

3 VIEW RIGHT FOOT

INDICATION

Right foot pain

TECHNIQUE

Frontal, oblique, and lateral radiographs of the right foot were submitted.

COMPARISONS

None

FINDINGS

There is no acute fracture or malalignment. Fifth metatarsal head resection re-demonstrated. Mild
great toe metatarsophalangeal osteoarthritis with osteophytosis. Bipartite tibial sesamoid.
Remaining metatarsal heads are round. No destructive bony lesion. Small plantar and posterior
calcaneal enthesophytes. Nonweightbearing images.

IMPRESSION

No acute bony abnormality. Mild great toe metatarsophalangeal osteoarthritis.

## 2019-03-25 NOTE — Progress Notes
Name: Miranda Cuevas          MRN: [id number]      DOB: April 14, 1956      AGE: 63 y.o.   DATE OF SERVICE: 03/26/2019    Subjective:             Reason for Visit:  Breast Problem      Miranda Cuevas is a 63 y.o. female.     DIAGNOSIS:  Right Breast Atypical Ductal Hyperplasia (2015), Family HX of Breast Cancer    ???  PHYSICIAN INFORMATION:    Referring Provider:  Melburn Popper, [facility]   OB/GYN:  Dr Jolaine Click, [facility], Phone: [phone]  PCP:  Dr Wyvonne [facility], Phone: [phone]  ???  REPRODUCTIVE HISTORY:   Age at frist menstrual cycle: 27  Age of first live birth: 57  Number of live births: 3  Number of pregnancies: 3  Breastfeeding: yes; total of 33 months.  Age at menopause: (uterus and ovaries intact); age 62   Oral Contraceptive Use:  No  Hormone Replacement Therapy (HRT) Use:  Patient describes she does not know the name of the HRT she took age 43 to 65.     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   ???    Family History   Problem Relation Age of Onset   ??? Hypertension Mother    ??? High Cholesterol Mother    ??? Arthritis-osteo Mother         Bilateral knee/hip replacements   ??? Osteoporosis Mother    ??? Atrial Fibrillation Mother    ??? Hypertension Father    ??? High Cholesterol Father    ??? Cancer-Breast Sister 49   ??? Arthritis-rheumatoid Sister    ??? Crohn's Disease Sister    ??? Cancer-Hematologic Sister 56        Lymphoma treated with radiation   ??? None Reported Daughter    ??? None Reported Daughter         Married 08/2017   ??? None Reported Daughter         Married    ??? Asthma Sister    ??? None Reported Grandchild      Sister (Miranda Cuevas) DX Breast Cancer age 31 (03/2017) and Lymphoma age 21 (did receive XRT)  Mother DX Skin Cancer (specific type unknown).  Maternal Aunt [name]) DX Lung Cancer.  Maternal Great Aunt [place]) DX Breast Cancer. Father DX Skin Cancer (specific type unknown).  Paternal Uncle (Richard) DX Skin Cancer (specific type unknown).  ???  Previous Genetic Testing: (Self, Family)  Self/Family Exact Results/Specific Mutations/Variances   None ???   ???  HISTORY OF PRESENT ILLNESS:   Patient returns to the Breast Cancer Prevention Clinic today for continued surveillance. She is at risk of the development of breast cancer due to a personal history of atypical ductal hyperplasia and FH (sister) of breast cancer. She was referred to my clinic by [name], PA-C, [facility].  She is feeling well, denies changes at this visit.       Review of Systems  No unusual fatigue or distress at this visit.   Traveled to [place] (03/2017; riverboat cruise from [place]).   Retired 05/02/2018; [name] and moved to [place] (new model house - reverse flip).   Husband retired 11/2017  Dr. [name] (sinus surgery) on 01/2019 due  to recurring sinus infections.  Weight (BMI 30.7; # 196 <- 31.17; # 199) - increased over the holidays ~ 10 pounds heavier.   Married; [name] x 39 years (10/20)  Genetic testing; none  Sister ([name]) diagnosed with HER-2 + breast cancer (03/2017).   Granddaughter Miranda Cuevas) had a baby [name] (12/05/2018). They live in [place], [place].  She and her husband stayed x 6 weeks.   Another granddaughter is due 04/2019 Miranda Cuevas; daughter Miranda Cuevas). They live in [place], [place].   No fever or chills.   No headache or dizziness.   No SOB or cough.   GERD - on Prilosec (90 days at a time)  EGD (2016)  No chest pain or palpations.  No current therapy for HTN.   Currently on medication for hyperlipidemia.  No family history of cardiac disease.   Mother (A-Fib; age 42), still living.   No breast changes.   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    Currently wearing a splint (arthritic bone removal; 10/03/2017 on the left). Right surgery 01/02/2018 [facility]; plastic surgery).   May have more knee surgery in 2020. No nausea, vomiting.   Issues with bowel function since 03/2017 (alternating diarrhea and constipation)  No dysuria or hematochezia.   Colonoscopy (2016); normal (diverticulitis)  No FH of colon cancer  LMP: uterus and ovaries intact (age 36); HRT x 10 years (stopped 2015 with diagnosis of atypia).   Hot flashes (> 20 years); has been on Prozac, now on 20mg  daily.   No vaginal dryness; not currently sexually active.   No rash, ulcerations of the skin or changing moles.   No FH of melanoma.   Patient  reports that she has never smoked. She has never used smokeless tobacco.  Patient  reports current alcohol use of about 3.0 - 5.0 standard drinks of alcohol per week.  Current exercise:   Bone density test (~ 5 years ago at [facility]); thinks the test was normal.   Long history of depression; takes Prozac. (refills through Dr. Chilton Si).       Objective:         ??? CALCIUM CARBONATE/VITAMIN D2 (CALCIUM + VITAMIN D PO) Take 1 tablet by mouth daily. 600 mg   ??? DOCOSAHEXANOIC ACID/EPA (FISH OIL PO) Take 520 mg by mouth daily.   ??? fluoxetine (PROZAC) 20 mg capsule Take 20 mg by mouth daily.   ??? KETOTIFEN FUMARATE (ZADITOR OP) Place  into or around eye(s).   ??? levothyroxine (SYNTHROID) 100 mcg tablet Take 1 tablet by mouth daily.   ??? NAPROXEN SODIUM (ALEVE PO) Take  by mouth daily.   ??? omeprazole (PRILOSEC PO) Take  by mouth.   ??? pravastatin (PRAVACHOL) 40 mg tablet Take 40 mg by mouth at bedtime daily.   ??? raloxifene (EVISTA) 60 mg tablet Take one tablet by mouth daily before breakfast.   ??? TRIAMCINOLONE ACETONIDE (NASACORT NA) Apply  into nose as directed daily.     Vitals:    03/26/19 0831   BP: 117/80   BP Source: Arm, Right Upper   Patient Position: Sitting   Pulse: 72   Resp: 18   Temp: 36.7 ???C (98.1 ???F)   TempSrc: Oral   SpO2: 96%   Weight: 94.3 kg (208 lb)   Height: 170.2 cm (67.01)   PainSc: Zero     Body mass index is 32.57 kg/m???.     Pain Score: Zero     Pain Addressed:  N/A Patient Evaluated  for a Clinical Trial: No treatment clinical trial available for this patient.     [place] Cooperative Oncology Group performance status is Not applicable. Marland Kitchen     Physical Exam  Vitals signs reviewed.   Chest:       Lymphadenopathy:      Cervical: No cervical adenopathy.      Upper Body:      Right upper body: No supraclavicular adenopathy.      Left upper body: No supraclavicular adenopathy.     This is a 63 year old female in no acute distress, well developed.   HEENT: No icterus.   Neck: No JVD, supple.   Chest: CTA bilaterally.   CV: RRR without murmur.  Abdomen: Soft, non-distended, non-tender, positive bowel sounds, no organomegaly.   Skin: No rash or suspicious appearing moles  Extremities: No clubbing, cyanosis or edema.   Back: No pain with palpation or percussion.   Neuro: CN II-XII grossly intact; no sensory or motor abnormalities noted.          Breast Health History & Imaging:  Date Test Results   01/02/13 Bilateral Screening Mammogram BI-RADS 1   04/06/14 Bilateral Screening Mammogram BI-RADS 0   04/06/14 Right DX Mammogram BI-RADS 4   04/08/14 Right Breast Needle BX Final Diagnosis:   A. Breast, right 10 o'clock with calcs breast, needle biopsy: Very focal atypical ductal hyperplasia with intraluminal microcalcifications and columnar cell changes. See comment.   B. Breast, right 10 o'clock no calcs breast, needle biopsy: Benign breast tissue. There is no evidence of malignancy.   Comment: Radiologic correlation for residual lesion is indicated.   06/10/14  Right Breast Lumpectomy Final Diagnosis:  A. Breast, right breast lumpectomy: ??? ??? Fibrocystic changes with usual ductal hyperplasia, cysts, apocrine metaplasia, and columnar cell changes. Changes consistent with previous biopsy site. There is no evidence of malignancy.        12/17/14 Bilateral DX Mammogram BI-RADS 2   12/22/15 Bilateral Screening Mammogram BI-RADS 1   01/09/17 Bilateral DX Mammogram BI-RADS 2   09/03/17 Right DX Mammogram Right Breast US BI-RADS 1 (Overall)     01/14/2018 BILATERAL MAMMOGRAM (TOMO):  ACR BI-RADS??? Assessments: BIRAD 2-Benign  07/18/2018 BREAST MRI: ACR BI-RADS??? Assessments: BIRAD 1-Negative  03/26/2019 BILATERAL MAMMOGRAM (TOMO):        BONE IMAGING:   12/23/2017 BMD:  Normal     LAB:   12/04/2017 CMP:  WNL         Breast Cancer Risk Assessment    Acute issues addressed today: Elevated risk for breast cancer development      Risk Assessment    There are several models to determine risk assessment based on personal and family history.   Each model is unique and is only an estimate of your risk compared to the general population.       Dayna Barker - this includes assessment based on your age, reproductive history and first degree relatives with breast cancer and your personal history of breast biopsies.    Your 5 year risk of developing breast cancer (calculated at 63 y.o.): 8.4 %  Average women???s risk:  1.8 %    Your lifetime risk of developing breast cancer (to age 72): 34.2 %   Average women???s risk:  8.8 %    Tyrer-Cuzick Model ??? this model includes a more extensive assessment of risk factors, including age, height, weight, reproductive history, benign breast lesions, history of hormone replacement therapy use and a more extensive evaluation of  your family history.     Your 10 year risk of developing breast cancer (calculated at 63 y.o.): 15.4 %  Average women???s risk: 3.6 %    Your lifetime risk of developing breast cancer: 33.3 %  Average women???s risk: 8.7 %       Genetic Testing Recommendations:    Was genetic testing recommended today?  no       Breast Cancer Screening Recommendations:    In addition to a clinical breast exam every 6 months, our screening imaging recommendations are:  Mammogram: 12 months   Include tomosynthesis (3D) if available?  yes    Ultrasound (ABUS): 12 months    Breast MRI: 12 months         Prevention Recommendations:   Was referral to a breast surgeon for preventive mastectomy discussed today? no    Was referral to a gynecologist for preventive oophorectomy discussed today? no     Was a chemoprevention medication discussed today? yes; Raloxifene (Evista) initiated 01/2018.    Additional prevention recommendations:  - Maintain a healthy body weight.    - Exercise regularly.  - Alcohol in moderation (less than 1 drink per day).         Please contact us with any future questions or concerns by calling:  For Ernie Hew, APRN: Corrie Dandy [phone]    If you would like to refer a family member, they can call [phone] to schedule an assessment.     1. History of ADH (2015) and FH of breast cancer.   2. Short term risk: Patient is currently taking raloxifene for the prevention of osteoporosis in postmenopausal women and reduction in the risk of breast cancer in postmenopausal women at high risk. Reviewed potential side effects including,  but not limited to hot flashes, flu-like syndrome, joint pain, rhinitis and deep vein thrombosis. Reminded patient to take this medication by mouth with a full glass of water at the same time each day.    3. Long term risk: Alternating breast imaging every 6 months (mammogram (TOMO) MRI due to lifetime risk > 20%) with clinical breast examination.   4. Discussed the purpose for the abbreviated breast MRI. Discussed the pros and cons of breast MRI. It has the potential advantage of increased sensitivity over mammogram. There is a 10-15% chance of detecting an ipsilateral breast and 3-5% chance of detecting contralateral breast chance missed by mammography. However, there is a 20% risk of false positive exam with need for additional CNB that could be negative.The abbreviated examination is performed at a decreased cost of $500.00 out of pocket. Patient would like to schedule.   5. RTC 09/2019 with comprehensive breast MRI and in 12 months with bilateral mammogram.    I have spent 30 minutes with the patient today. 25 minutes spent face to face in evaluation, management, counseling and coordination of care functions.   Collaborating physician: Joni Reining, MD  NPI # [id number]      Ernie Hew, APRN

## 2019-03-26 ENCOUNTER — Encounter: Admit: 2019-03-26 | Discharge: 2019-03-26

## 2019-03-26 DIAGNOSIS — N6091 Unspecified benign mammary dysplasia of right breast: Secondary | ICD-10-CM

## 2019-03-26 DIAGNOSIS — Z79810 Long term (current) use of selective estrogen receptor modulators (SERMs): Secondary | ICD-10-CM

## 2019-03-26 DIAGNOSIS — Z1231 Encounter for screening mammogram for malignant neoplasm of breast: Secondary | ICD-10-CM

## 2019-03-26 DIAGNOSIS — Z803 Family history of malignant neoplasm of breast: Secondary | ICD-10-CM

## 2019-03-26 DIAGNOSIS — Z1239 Encounter for other screening for malignant neoplasm of breast: Secondary | ICD-10-CM

## 2019-03-26 DIAGNOSIS — Z9189 Other specified personal risk factors, not elsewhere classified: Secondary | ICD-10-CM

## 2019-03-26 DIAGNOSIS — E039 Hypothyroidism, unspecified: Secondary | ICD-10-CM

## 2019-03-26 DIAGNOSIS — M1990 Unspecified osteoarthritis, unspecified site: Secondary | ICD-10-CM

## 2019-03-26 DIAGNOSIS — M1810 Unilateral primary osteoarthritis of first carpometacarpal joint, unspecified hand: Secondary | ICD-10-CM

## 2019-03-26 DIAGNOSIS — M549 Dorsalgia, unspecified: Secondary | ICD-10-CM

## 2019-03-26 NOTE — Patient Instructions
Thank you for coming to see us today.   Please call our office or send a message through MyChart if you have any questions or concerns.                                                                                                                                                           Marl Seago RN, BSN, OCN, CBCN, CN-BN  Clinical Nurse Coordinator for Lori Ranallo, APRN-BC        Breast Cancer Survivorship/High Risk Breast Clinic  Lymphedema Screening & Prevention Program   [phone] (phone)  [phone] (fax)  [phone] (scheduling)  [email or web address]      [facility]   Clinic: Monday, Thursday, Friday   Admin: Wednesday  [address]  [place], [place] [address]   [facility]   Clinic: Tuesday  [address]  [place], [place] [address]       For up to date information on the COVID-19 virus, visit the CDC website. https://www.cdc.gov/coronavirus   General supportive care during cold and flu season and infection prevention reminders:    o Wash hands often with soap and water for at least 20 seconds   o Cover your mouth and nose   o Social distancing: try to maintain 6 feet between you and other people   o Stay home if sick and symptoms mild or manageable?   If you must be around people wear a mask     If you are having symptoms of a lower respiratory infection (cough, shortness of breath) and/or fever AND either traveled in last 30 days (internationally or to region of exposure) OR known exposure to patient with COVID19:     o Call your primary care provider for questions or health needs.    Tell your doctor about your recent travel and your symptoms     o In a medical emergency, call 911 or go to the nearest emergency room.

## 2019-09-16 ENCOUNTER — Encounter: Admit: 2019-09-16 | Discharge: 2019-09-16 | Payer: BC Managed Care – PPO

## 2019-09-16 DIAGNOSIS — M1990 Unspecified osteoarthritis, unspecified site: Secondary | ICD-10-CM

## 2019-09-16 DIAGNOSIS — M549 Dorsalgia, unspecified: Secondary | ICD-10-CM

## 2019-09-16 DIAGNOSIS — Z1239 Encounter for other screening for malignant neoplasm of breast: Secondary | ICD-10-CM

## 2019-09-16 DIAGNOSIS — N6091 Unspecified benign mammary dysplasia of right breast: Secondary | ICD-10-CM

## 2019-09-16 DIAGNOSIS — M1810 Unilateral primary osteoarthritis of first carpometacarpal joint, unspecified hand: Secondary | ICD-10-CM

## 2019-09-16 DIAGNOSIS — Z9189 Other specified personal risk factors, not elsewhere classified: Secondary | ICD-10-CM

## 2019-09-16 DIAGNOSIS — E039 Hypothyroidism, unspecified: Secondary | ICD-10-CM

## 2019-09-16 MED ORDER — SODIUM CHLORIDE 0.9 % IJ SOLN
50 mL | Freq: Once | INTRAVENOUS | 0 refills | Status: CP
Start: 2019-09-16 — End: ?
  Administered 2019-09-16: 21:00:00 50 mL via INTRAVENOUS

## 2019-09-16 MED ORDER — GADOBENATE DIMEGLUMINE 529 MG/ML (0.1MMOL/0.2ML) IV SOLN
20 mL | Freq: Once | INTRAVENOUS | 0 refills | Status: CP
Start: 2019-09-16 — End: ?
  Administered 2019-09-16: 21:00:00 20 mL via INTRAVENOUS

## 2019-09-16 NOTE — Progress Notes
Name: Miranda Cuevas          MRN: [id number]      DOB: 1956-06-08      AGE: 63 y.o.   DATE OF SERVICE: 09/17/2019    Subjective:             Reason for Visit:  Breast Problem      Miranda Cuevas is a 63 y.o. female.     DIAGNOSIS:  Right Breast Atypical Ductal Hyperplasia (2015), Family HX of Breast Cancer    ?  PHYSICIAN INFORMATION:    Referring Provider:  Melburn Popper, [facility]   OB/GYN:  Dr Jolaine Click, [facility], Phone: [phone]  PCP:  Dr Wyvonne L[facility], Phone: [phone]  ?  REPRODUCTIVE HISTORY:   Age at frist menstrual cycle: 43  Age of first live birth: 28  Number of live births: 3  Number of pregnancies: 3  Breastfeeding: yes; total of 33 months.  Age at menopause: (uterus and ovaries intact); age 66   Oral Contraceptive Use:  No  Hormone Replacement Therapy (HRT) Use:  Patient describes she does not know the name of the HRT she took age 81 to 38.     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   ?    Family History   Problem Relation Age of Onset   ? Hypertension Mother    ? High Cholesterol Mother    ? Arthritis-osteo Mother         Bilateral knee/hip replacements   ? Osteoporosis Mother    ? Atrial Fibrillation Mother    ? Cancer-Skin Mother    ? Hypertension Father    ? High Cholesterol Father    ? Cancer-Skin Father    ? Cancer-Breast Sister 50   ? Arthritis-rheumatoid Sister    ? Crohn's Disease Sister    ? Cancer-Hematologic Sister 37        Lymphoma treated with radiation   ? None Reported Daughter    ? None Reported Daughter         Married 08/2017   ? None Reported Daughter         Married    ? Asthma Sister    ? None Reported Grandchild    ? Cancer-Breast Other         MGAunt   ? Cancer-Skin Paternal Uncle    ? Cancer-Lung Maternal Aunt        Previous Genetic Testing: (Self, Family) Self/Family Exact Results/Specific Mutations/Variances   None ?   ?  HISTORY OF PRESENT ILLNESS:   Patient returns to the Breast Cancer Prevention Clinic today for continued surveillance. She is at risk of the development of breast cancer due to a personal history of atypical ductal hyperplasia and FH (sister) of breast cancer. She was referred to my clinic by [name], PA-C, [facility].  She is feeling well, denies changes at this visit.       Review of Systems  No unusual fatigue or distress at this visit.   Traveled to [place] (03/2017; riverboat cruise from [place]).   Retired 05/02/2018; [name] and moved to [place] (new model house - reverse flip).   Husband retired 11/2017. Married; [name] x 39 years (10/20)  Dr. [name] (sinus surgery) on 01/2019 due to recurring sinus infections.  Developed another sinus infection in 07/2019;  went on antibiotics again (Doxy did not work, then prednisone and a different antibiotic)  Weight (BMI 31.32; # 200 <- 30.7; # 196 <- 31.17; # 199) - increased over the holidays ~ 10 pounds heavier.   Genetic testing; none  Sister [name]) diagnosed with HER-2 + breast cancer (03/2017); doing well.   Granddaughter [name]) had a baby [name] (12/05/2018). They live in [place], [place].  Another granddaughter is due 04/2019 [name]; daughter [name]). They live in [place], [place].   Grandson (Will; age 87 lives in the [facility] area)  No fever or chills.   No headache or dizziness.   No SOB or cough.   GERD - on Prilosec (90 days at a time)  EGD (2016)  No chest pain or palpations.  No current therapy for HTN.   Currently on medication for hyperlipidemia.  No family history of cardiac disease.   Mother (A-Fib; age 22), still living.   No breast changes.   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    Currently wearing a splint (arthritic bone removal; 10/03/2017 on the left). Right surgery 01/02/2018 [facility]; plastic surgery). May have more knee surgery in 2020.   No nausea, vomiting.   Issues with bowel function since 03/2017 (alternating diarrhea and constipation)  No dysuria or hematochezia.   Colonoscopy (09/01/2019); normal (diverticulitis); [facility].  No FH of colon cancer  LMP: uterus and ovaries intact (age 69); HRT x 10 years (stopped 2015 with diagnosis of atypia).   Hot flashes (> 20 years); has been on Prozac, now on 20mg  daily.   No vaginal dryness; not currently sexually active.   No rash, ulcerations of the skin or changing moles.   No FH of melanoma.   Patient  reports that she has never smoked. She has never used smokeless tobacco.  Patient  reports current alcohol use of about 3.0 - 5.0 standard drinks of alcohol per week.  Current exercise:   Bone density test (~ 5 years ago at [facility]); thinks the test was normal.   Long history of depression; takes Prozac. (refills through Dr. Chilton Si).       Objective:         ? CALCIUM CARBONATE/VITAMIN D2 (CALCIUM + VITAMIN D PO) Take 1 tablet by mouth daily. 600 mg   ? DOCOSAHEXANOIC ACID/EPA (FISH OIL PO) Take 520 mg by mouth daily.   ? fluoxetine (PROZAC) 20 mg capsule Take 20 mg by mouth daily.   ? KETOTIFEN FUMARATE (ZADITOR OP) Place  into or around eye(s).   ? levothyroxine (SYNTHROID) 100 mcg tablet Take 1 tablet by mouth daily.   ? NAPROXEN SODIUM (ALEVE PO) Take  by mouth daily.   ? omeprazole (PRILOSEC PO) Take  by mouth.   ? pravastatin (PRAVACHOL) 40 mg tablet Take 40 mg by mouth at bedtime daily.   ? TRIAMCINOLONE ACETONIDE (NASACORT NA) Apply  into nose as directed daily.     Vitals:    09/17/19 0754   BP: 132/69   BP Source: Arm, Right Upper   Patient Position: Sitting   Pulse: 63   Resp: 16   Temp: 36.2 ?C (97.2 ?F)   TempSrc: Skin   SpO2: 99%   Weight: 90.7 kg (200 lb)   Height: 170.2 cm (67.01)   PainSc: Zero     Body mass index is 31.32 kg/m?Marland Kitchen     Pain Score: Zero     Pain Addressed:  N/A Patient Evaluated for a Clinical Trial: No  treatment clinical trial available for this patient.     [place] Cooperative Oncology Group performance status is Not applicable. Marland Kitchen     Physical Exam  Vitals signs reviewed.   Chest:       Lymphadenopathy:      Cervical: No cervical adenopathy.      Upper Body:      Right upper body: No supraclavicular adenopathy.      Left upper body: No supraclavicular adenopathy.     This is a 63 year old female in no acute distress, well developed.   HEENT: No icterus.   Neck: No JVD, supple.   Chest: CTA bilaterally.   CV: RRR without murmur.  Abdomen: Soft, non-distended, non-tender, positive bowel sounds, no organomegaly.   Skin: No rash or suspicious appearing moles  Extremities: No clubbing, cyanosis or edema.   Back: No pain with palpation or percussion.   Neuro: CN II-XII grossly intact; no sensory or motor abnormalities noted.          Breast Health History & Imaging:  Date Test Results   01/02/13 Bilateral Screening Mammogram BI-RADS 1   04/06/14 Bilateral Screening Mammogram BI-RADS 0   04/06/14 Right DX Mammogram BI-RADS 4   04/08/14 Right Breast Needle BX Final Diagnosis:   A. Breast, right 10 o'clock with calcs breast, needle biopsy: Very focal atypical ductal hyperplasia with intraluminal microcalcifications and columnar cell changes. See comment.   B. Breast, right 10 o'clock no calcs breast, needle biopsy: Benign breast tissue. There is no evidence of malignancy.   Comment: Radiologic correlation for residual lesion is indicated.   06/10/14  Right Breast Lumpectomy Final Diagnosis:  A. Breast, right breast lumpectomy: ? ? Fibrocystic changes with usual ductal hyperplasia, cysts, apocrine metaplasia, and columnar cell changes. Changes consistent with previous biopsy site. There is no evidence of malignancy.        12/17/14 Bilateral DX Mammogram BI-RADS 2   12/22/15 Bilateral Screening Mammogram BI-RADS 1   01/09/17 Bilateral DX Mammogram BI-RADS 2   09/03/17 Right DX Mammogram Right Breast US BI-RADS 1 (Overall)     01/14/2018 BILATERAL MAMMOGRAM (TOMO):  ACR BI-RADS? Assessments: BIRAD 2-Benign  07/18/2018 BREAST MRI: ACR BI-RADS? Assessments: BIRAD 1-Negative  03/26/2019 BILATERAL MAMMOGRAM (TOMO):  ASSESSMENT: BIRAD 2-Benign   09/17/2019 BREAST MRI: ASSESSMENT: BIRAD 1-Negative     BONE IMAGING:   12/23/2017 BMD:  Normal   04/2019 BMD: [facility] (normal)    LAB:   12/04/2017 CMP:  WNL         Breast Cancer Risk Assessment    Acute issues addressed today: Elevated risk for breast cancer development      Risk Assessment    There are several models to determine risk assessment based on personal and family history.   Each model is unique and is only an estimate of your risk compared to the general population.       Dayna Barker - this includes assessment based on your age, reproductive history and first degree relatives with breast cancer and your personal history of breast biopsies.    Your 5 year risk of developing breast cancer (calculated at 63 y.o.): 8.4 %  Average women?s risk:  1.8 %    Your lifetime risk of developing breast cancer (to age 58): 34.2 %   Average women?s risk:  8.8 %    Tyrer-Cuzick Model ? this model includes a more extensive assessment of risk factors, including age, height, weight, reproductive history, benign breast lesions, history of hormone  replacement therapy use and a more extensive evaluation of your family history.     Your 10 year risk of developing breast cancer (calculated at 63 y.o.): 15.4 %  Average women?s risk: 3.6 %    Your lifetime risk of developing breast cancer: 33.3 %  Average women?s risk: 8.7 %         Breast Cancer Screening Recommendations:    In addition to a clinical breast exam every 6 months, our screening imaging recommendations are:  Mammogram: 12 months   Include tomosynthesis (3D) if available?  yes    Breast MRI: 12 months         Prevention Recommendations:   Was referral to a breast surgeon for preventive mastectomy discussed today? no Was referral to a gynecologist for preventive oophorectomy discussed today? no     Was a chemoprevention medication discussed today? yes; Raloxifene (Evista) initiated 01/2018.    Additional prevention recommendations:  - Maintain a healthy body weight.    - Exercise regularly.  - Alcohol in moderation (less than 1 drink per day).         1. History of ADH (2015) and FH of breast cancer (sister and MGAunt).   2. Discussed options for genetic testing based on her current risk, and utility for clinical management including screening options (mammogram/ultrasound/MRI), preventive surgery, and other strategies (Tamoxifen, Raloxifene/AI) for risk reduction. Based on current NCCN guidelines she does not meet criteria for BRCA1/2 genetic testing. At present, BRCA1/2 testing can be performed as part of the [facility] Mutli Cancer Panel for $250.00 out of pocket. This panel analyzes 83 genes related to inherited cancers across major organs systems: breast, GYN, GI, endocrine, GU, skin, brain/nervous system, sarcomas and hematologic cancers If detected early, may have effective medical interventions and preventive measures. This comprehensive panel provides information on clinically actionable and clinically inactionable gene mutations. At present, it is likely that at least 25% of tests performed will identify variants of uncertain significance, which will require management decisions to be based on personal and family history of cancer. Patient was provided with information regarding the genetic testing, acknowledged understanding of the possible results and provided a blood sample. This will be shipped to [facility]. Results may take up to 4-6 weeks and she will be contacted at that time for results, disclosure and next steps.   3. Short term risk: Patient did try raloxifene for approximately 6 weeks; discontinued due to severe side effects of hot flashes. 4. Long term risk: Alternating breast imaging every 6 months (mammogram (TOMO) MRI due to lifetime risk > 20%) with clinical breast examination.   5. RTC 6 months with bilateral mammogram and 12 months with breast MRI      I have spent 50 minutes with the patient today. 50 minutes spent face to face in evaluation, management, counseling and coordination of care functions.   Collaborating physician: Joni Reining, MD  NPI # [id number]      Ernie Hew, APRN

## 2019-09-17 ENCOUNTER — Encounter: Admit: 2019-09-17 | Discharge: 2019-09-17 | Payer: BC Managed Care – PPO

## 2019-09-17 DIAGNOSIS — E559 Vitamin D deficiency, unspecified: Secondary | ICD-10-CM

## 2019-09-17 DIAGNOSIS — Z1239 Encounter for other screening for malignant neoplasm of breast: Secondary | ICD-10-CM

## 2019-09-17 DIAGNOSIS — M1810 Unilateral primary osteoarthritis of first carpometacarpal joint, unspecified hand: Secondary | ICD-10-CM

## 2019-09-17 DIAGNOSIS — Z803 Family history of malignant neoplasm of breast: Secondary | ICD-10-CM

## 2019-09-17 DIAGNOSIS — E039 Hypothyroidism, unspecified: Secondary | ICD-10-CM

## 2019-09-17 DIAGNOSIS — Z9189 Other specified personal risk factors, not elsewhere classified: Secondary | ICD-10-CM

## 2019-09-17 DIAGNOSIS — M549 Dorsalgia, unspecified: Secondary | ICD-10-CM

## 2019-09-17 DIAGNOSIS — N6091 Unspecified benign mammary dysplasia of right breast: Secondary | ICD-10-CM

## 2019-09-17 DIAGNOSIS — M1990 Unspecified osteoarthritis, unspecified site: Secondary | ICD-10-CM

## 2019-09-17 DIAGNOSIS — Z1379 Encounter for other screening for genetic and chromosomal anomalies: Secondary | ICD-10-CM

## 2019-09-17 LAB — 25-OH VITAMIN D (D2 + D3): Lab: 30 ng/mL (ref 30–80)

## 2019-09-17 NOTE — Patient Instructions
Thank you for coming to see us today.   Please call our office or send a message through MyChart if you have any questions or concerns.                                                                                                                                                           Leif Loflin RN, BSN, OCN, CBCN, CN-BN  Clinical Nurse Coordinator for Lori Ranallo, APRN-BC        Breast Cancer Survivorship/High Risk Breast Clinic  Lymphedema Screening & Prevention Program   [phone] (phone)  [phone] (fax)  [phone] (scheduling)      [facility]   Clinic: Monday, Thursday, Friday   Admin: Tuesday  [address]  [place], [place] [address]   [facility]   Clinic: Wednesday  [address]  [place],  [address]       For up to date information on the COVID-19 virus, visit the CDC website. https://www.cdc.gov/coronavirus   General supportive care during cold and flu season and infection prevention reminders:    o Wash hands often with soap and water for at least 20 seconds   o Cover your mouth and nose   o Social distancing: try to maintain 6 feet between you and other people   o Stay home if sick and symptoms mild or manageable?   If you must be around people wear a mask     If you are having symptoms of a lower respiratory infection (cough, shortness of breath) and/or fever AND either traveled in last 30 days (internationally or to region of exposure) OR known exposure to patient with COVID19:     o Call your primary care provider for questions or health needs.    Tell your doctor about your recent travel and your symptoms     o In a medical emergency, call 911 or go to the nearest emergency room.

## 2019-09-28 ENCOUNTER — Encounter: Admit: 2019-09-28 | Discharge: 2019-09-28 | Payer: BC Managed Care – PPO

## 2019-09-28 DIAGNOSIS — E039 Hypothyroidism, unspecified: Secondary | ICD-10-CM

## 2019-09-28 DIAGNOSIS — M1990 Unspecified osteoarthritis, unspecified site: Secondary | ICD-10-CM

## 2019-09-28 DIAGNOSIS — M549 Dorsalgia, unspecified: Secondary | ICD-10-CM

## 2019-09-28 DIAGNOSIS — N6091 Unspecified benign mammary dysplasia of right breast: Secondary | ICD-10-CM

## 2019-09-28 DIAGNOSIS — M1810 Unilateral primary osteoarthritis of first carpometacarpal joint, unspecified hand: Secondary | ICD-10-CM

## 2019-09-28 DIAGNOSIS — Z1379 Encounter for other screening for genetic and chromosomal anomalies: Secondary | ICD-10-CM

## 2020-03-29 NOTE — Patient Instructions
Thank you for coming to see us today.   Please call our office or send a message through MyChart if you have any questions or concerns.                                                                                                                                                           Mary Williams RN, BSN, OCN, CBCN, CN-BN  Clinical Nurse Coordinator for Lori Ranallo, APRN-BC        Breast Cancer Survivorship/High Risk Breast Clinic  Lymphedema Screening & Prevention Program   [phone] (phone)  [phone] (fax)      [facility]   Clinic: Monday, Thursday, Friday   Admin: Tuesday  [address]  [place], [place] [address]  Scheduling: [phone]  Fax: [phone]   [facility]   Clinic: Wednesday  [address]  [place], [place] [address]  Scheduling: [phone] (Tara) or [phone] (Nicki)  Fax: [phone] or [phone]

## 2020-03-29 NOTE — Progress Notes
Name: Miranda Cuevas [facility]          MRN: [id number]      DOB: 13-Aug-1956      AGE: 64 y.o.   DATE OF SERVICE: 03/31/2020    Subjective:             Reason for Visit:  Breast Problem      Miranda Cuevas is a 64 y.o. female.     DIAGNOSIS:  Right Breast Atypical Ductal Hyperplasia (2015), Family HX of Breast Cancer    ?  PHYSICIAN INFORMATION:    Referring Provider:  Melburn Popper, [facility]   OB/GYN:  Dr Jolaine Click, [facility], Phone: [phone]  PCP:  Dr Wyvonne Lenz [facility], Phone: [phone]  ?  REPRODUCTIVE HISTORY:   Age at frist menstrual cycle: 62  Age of first live birth: 31  Number of live births: 3  Number of pregnancies: 3  Breastfeeding: yes; total of 33 months.  Age at menopause: (uterus and ovaries intact); age 64   Oral Contraceptive Use:  No  Hormone Replacement Therapy (HRT) Use:  Patient describes she does not know the name of the HRT she took age 2 to 48.     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   ?    Family History   Problem Relation Age of Onset   ? Hypertension Mother    ? High Cholesterol Mother    ? Arthritis-osteo Mother         Bilateral knee/hip replacements   ? Osteoporosis Mother    ? Atrial Fibrillation Mother    ? Cancer-Skin Mother    ? Hypertension Father    ? High Cholesterol Father    ? Cancer-Skin Father    ? Cancer-Breast Sister 21   ? Arthritis-rheumatoid Sister    ? Crohn's Disease Sister    ? Cancer-Hematologic Sister 19        Lymphoma treated with radiation   ? None Reported Daughter    ? None Reported Daughter         Married 08/2017   ? None Reported Daughter         Married    ? Asthma Sister    ? None Reported Grandchild    ? Cancer-Skin Paternal Uncle    ? Cancer-Lung Maternal Aunt    ? Cancer-Breast Other         MGA       Previous Genetic Testing: (Self, Family)  Self/Family Exact Results/Specific Mutations/Variances   None ?   ?  GENETIC TESTING:   09/17/2019 [facility] Multi-Cancer Panel Test: no mutation.   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 Remains variant of uncertain significance.    HISTORY OF PRESENT ILLNESS:   Patient returns to the Breast Cancer Prevention Clinic today for continued surveillance. She is at risk of the development of breast cancer due to a personal history of atypical ductal hyperplasia and FH (sister) of breast cancer. She was referred to my clinic by [name], PA-C, [facility].  She is feeling well, denies changes at this visit.       Review of Systems  No unusual fatigue or distress at this visit.   Traveled to [place] (03/2017; riverboat cruise from [place]).   Retired 05/02/2018; [name] and moved to [place] (  new model house - reverse flip).   Husband retired 11/2017. Married; [name] x 39 years (10/20)  Expecting twin grandchildren 06/23/20 boy Miranda Cuevas) [place] Levo and girl (name TBD)  COVID vaccination completed (12/22/2019 and 01/12/2020; Pfizer).   Dr. [name] (sinus surgery) on 01/2019 due to recurring sinus infections.  Weight (BMI 32.25; # 206 <- 31.32; # 200 <- 30.7; # 196 <- 31.17; # 199)   Started [facility] 03/2020. Very excited about a structured weight loss program.   09/17/2019 [facility] Multi-Cancer Panel Test: no mutation.   VUS; BARD1 c.542A>G (p.Glu181Gly)  Sister [name]) diagnosed with HER-2 + breast cancer (03/2017); doing well.   Granddaughter Miranda Cuevas) had a baby Miranda Cuevas (12/05/2018). They live in [place], [place].  Another granddaughter is due 04/2019 Miranda Cuevas; daughter Miranda Cuevas). They live in [place], [place].   Grandson (Will; age 65 lives in the [facility] area)  No fever or chills.   No headache or dizziness.   No SOB or cough.   GERD - on Prilosec (90 days at a time)  EGD (2016)  No chest pain or palpations.  Diagnosed with asthma this spring.   No current therapy for HTN.   Currently on medication for hyperlipidemia.  No family history of cardiac disease.   Mother (A-Fib; age 74), still living.   No breast changes.   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    Currently wearing a splint (arthritic bone removal; 10/03/2017 on the left). Right surgery 01/02/2018 [facility]; plastic surgery).   May have more knee surgery in 2020.   No nausea, vomiting.   Issues with bowel function since 03/2017 (alternating diarrhea and constipation)  No dysuria or hematochezia.   Colonoscopy (09/01/2019); normal (diverticulitis[facility].  No FH of colon cancer  LMP: uterus and ovaries intact (age 60); HRT x 10 years (stopped 2015 with diagnosis of atypia).   Hot flashes (> 20 years); has been on Prozac, now on 20mg  daily.   No vaginal dryness; not currently sexually active.   No rash, ulcerations of the skin or changing moles.   No FH of melanoma.   Patient  reports that she has never smoked. She has never used smokeless tobacco.  Patient  reports current alcohol use of about 3.0 - 5.0 standard drinks of alcohol per week.  Current exercise:   Bone density test (~ 5 years ago at [facility]); thinks the test was normal.   Long history of depression; takes Prozac. (refills through Dr. Chilton Si).       Objective:         ? albuterol sulfate (PROAIR HFA) 90 mcg/actuation HFA aerosol inhaler INHALE 2 PUFFS BY MOUTH EVERY 4 HOURS   ? CALCIUM CARBONATE/VITAMIN D2 (CALCIUM + VITAMIN D PO) Take 1 tablet by mouth daily. 600 mg   ? DOCOSAHEXANOIC ACID/EPA (FISH OIL PO) Take 520 mg by mouth daily.   ? FLUoxetine (PROZAC) 20 mg capsule Take 1 capsule by mouth daily.   ? KETOTIFEN FUMARATE (ZADITOR OP) Place  into or around eye(s).   ? levocetirizine dihydrochloride (XYZAL PO) Take  by mouth daily.   ? NAPROXEN SODIUM (ALEVE PO) Take  by mouth daily.   ? omeprazole (PRILOSEC PO) Take  by mouth.   ? pravastatin (PRAVACHOL) 40 mg tablet Take 40 mg by mouth at bedtime daily.   ? SYNTHROID 100 mcg tablet Take 100 mcg by mouth daily.   ? TRIAMCINOLONE ACETONIDE (NASACORT NA) Apply  into nose as directed daily.  Vitals:    03/31/20 0847   BP: 134/82   BP Source: Arm, Right Upper   Patient Position: Sitting   Pulse: 65   Resp: 16   Temp: 36.6 ?C (97.9 ?F)   SpO2: 100%   Weight: 93.4 kg (206 lb)   Height: 170.2 cm (67.01)   PainSc: Zero     Body mass index is 32.25 kg/m?Marland Kitchen     Pain Score: Zero     Pain Addressed:  N/A    Patient Evaluated for a Clinical Trial: No treatment clinical trial available for this patient.     [place] Cooperative Oncology Group performance status is Not applicable. Marland Kitchen     Physical Exam  Vitals reviewed.   Chest:       Lymphadenopathy:      Cervical: No cervical adenopathy.      Upper Body:      Right upper body: No supraclavicular adenopathy.      Left upper body: No supraclavicular adenopathy.     This is a 64 year old female in no acute distress, well developed.   09/17/2019 [facility]ae Multi-Cancer Panel Test: no mutation.   VUS; BARD1 c.542A>G (p.Glu181Gly)  HEENT: No icterus.   Neck: No JVD, supple.   Chest: CTA bilaterally.   CV: RRR without murmur.  Abdomen: Soft, non-distended, non-tender, positive bowel sounds, no organomegaly.   Skin: No rash or suspicious appearing moles  Extremities: No clubbing, cyanosis or edema.   Back: No pain with palpation or percussion.   Neuro: CN II-XII grossly intact; no sensory or motor abnormalities noted.          Breast Health History & Imaging:  Date Test Results   01/02/13 Bilateral Screening Mammogram BI-RADS 1   04/06/14 Bilateral Screening Mammogram BI-RADS 0   04/06/14 Right DX Mammogram BI-RADS 4   04/08/14 Right Breast Needle BX Final Diagnosis:   A. Breast, right 10 o'clock with calcs breast, needle biopsy: Very focal atypical ductal hyperplasia with intraluminal microcalcifications and columnar cell changes. See comment.   B. Breast, right 10 o'clock no calcs breast, needle biopsy: Benign breast tissue. There is no evidence of malignancy.   Comment: Radiologic correlation for residual lesion is indicated.   06/10/14  Right Breast Lumpectomy Final Diagnosis:  A. Breast, right breast lumpectomy: ? ? Fibrocystic changes with usual ductal hyperplasia, cysts, apocrine metaplasia, and columnar cell changes. Changes consistent with previous biopsy site. There is no evidence of malignancy.        12/17/14 Bilateral DX Mammogram BI-RADS 2   12/22/15 Bilateral Screening Mammogram BI-RADS 1   01/09/17 Bilateral DX Mammogram BI-RADS 2   09/03/17 Right DX Mammogram  Right Breast US BI-RADS 1 (Overall)     01/14/2018 BILATERAL MAMMOGRAM (TOMO):  ACR BI-RADS? Assessments: BIRAD 2-Benign  07/18/2018 BREAST MRI: ACR BI-RADS? Assessments: BIRAD 1-Negative  03/26/2019 BILATERAL MAMMOGRAM (TOMO):  ASSESSMENT: BIRAD 2-Benign   09/17/2019 BREAST MRI: ASSESSMENT: BIRAD 1-Negative   03/31/2020 BILATERAL MAMMOGRAM (TOMO):  ASSESSMENT: BIRAD 2-Benign     BONE IMAGING:   12/23/2017 BMD:  Normal   04/2019 BMD: [facility] (normal)    LAB:   12/04/2017 CMP:  WNL         Breast Cancer Risk Assessment    Acute issues addressed today: Elevated risk for breast cancer development      Risk Assessment    Dayna Barker - this includes assessment based on your age, reproductive history and first degree relatives with breast cancer and your personal history  of breast biopsies.    Your 5 year risk of developing breast cancer (calculated at 64 y.o.): 8.4 %  Average women?s risk:  1.8 %    Your lifetime risk of developing breast cancer (to age 33): 34.2 %   Average women?s risk:  8.8 %    Tyrer-Cuzick Model ? this model includes a more extensive assessment of risk factors, including age, height, weight, reproductive history, benign breast lesions, history of hormone replacement therapy use and a more extensive evaluation of your family history.     Your 10 year risk of developing breast cancer (calculated at 64 y.o.): 15.4 %  Average women?s risk: 3.6 %    Your lifetime risk of developing breast cancer: 33.3 %  Average women?s risk: 8.7 %         Breast Cancer Screening Recommendations:    In addition to a clinical breast exam every 6 months, our screening imaging recommendations are:  Mammogram: 12 months   Include tomosynthesis (3D) if available?  yes    Breast MRI: 12 months           Prevention recommendations:    - Maintain a healthy body weight.    - Exercise regularly.  - Alcohol in moderation (less than 1 drink per day).         1. History of ADH (2015) and FH of breast cancer (sister and MGAunt).   2. Genetic testing. 09/17/2019 [facility]ae Multi-Cancer Panel Test: no mutation. VUS; BARD1 c.542A>G (p.Glu181Gly).   3. Short term risk: Patient did try raloxifene for approximately 6 weeks; discontinued due to severe side effects of hot flashes.   4. Long term risk: Alternating breast imaging every 6 months (mammogram (TOMO) MRI due to lifetime risk > 20%) with clinical breast examination.   5. RTC 6 months with breast MRI and 12 months with bilateral mammogram    I have spent 5 minutes preparing for this visit. I have spent 20 minutes face to face in evaluation, management, counseling and coordination of care functions.   Total time: 25 minutes.     Collaborating physician: Joni Reining, MD  NPI # [id number]    Sharia Reeve, APRN-NP

## 2020-03-31 ENCOUNTER — Encounter: Admit: 2020-03-31 | Discharge: 2020-03-31 | Payer: BC Managed Care – PPO

## 2020-03-31 DIAGNOSIS — Z1239 Encounter for other screening for malignant neoplasm of breast: Secondary | ICD-10-CM

## 2020-03-31 DIAGNOSIS — M1990 Unspecified osteoarthritis, unspecified site: Secondary | ICD-10-CM

## 2020-03-31 DIAGNOSIS — Z1379 Encounter for other screening for genetic and chromosomal anomalies: Secondary | ICD-10-CM

## 2020-03-31 DIAGNOSIS — Z1231 Encounter for screening mammogram for malignant neoplasm of breast: Secondary | ICD-10-CM

## 2020-03-31 DIAGNOSIS — M1810 Unilateral primary osteoarthritis of first carpometacarpal joint, unspecified hand: Secondary | ICD-10-CM

## 2020-03-31 DIAGNOSIS — Z803 Family history of malignant neoplasm of breast: Secondary | ICD-10-CM

## 2020-03-31 DIAGNOSIS — N6091 Unspecified benign mammary dysplasia of right breast: Secondary | ICD-10-CM

## 2020-03-31 DIAGNOSIS — E039 Hypothyroidism, unspecified: Secondary | ICD-10-CM

## 2020-03-31 DIAGNOSIS — M549 Dorsalgia, unspecified: Secondary | ICD-10-CM

## 2020-09-12 ENCOUNTER — Encounter: Admit: 2020-09-12 | Discharge: 2020-09-12 | Payer: BC Managed Care – PPO

## 2020-09-14 NOTE — Patient Instructions
Thank you for coming to see us today.   Please call our office or send a message through MyChart if you have any questions or concerns.                                                                                                                                                           Mary Williams RN, BSN, OCN, CBCN, CN-BN  Clinical Nurse Coordinator for Lori Ranallo, APRN-BC        Breast Cancer Survivorship/High Risk Breast Clinic  Lymphedema Screening & Prevention Program   [phone] (phone)  [phone] (fax)      [facility]   Clinic: Monday, Thursday, Friday   Admin: Tuesday  [address]  [place], [place] [address]  Scheduling: [phone]  Fax: [phone]   [facility]   Clinic: Wednesday  [address]  [place], [place] [address]  Scheduling: [phone] (Tara) or [phone] (Nicki)  Fax: [phone] or [phone]

## 2020-09-15 NOTE — Progress Notes
Name: [facility]          MRN: [id number]      DOB: [birth year]      AGE: 64 y.o.   DATE OF SERVICE: 09/19/2020    Subjective:             Reason for Visit:  Breast Problem      Miranda Cuevas is a 64 y.o. female.     DIAGNOSIS:  Right Breast Atypical Ductal Hyperplasia (2015), Family HX of Breast Cancer    ?  PHYSICIAN INFORMATION:    Referring Provider:  Melburn Popper, [facility]   OB/GYN:  Dr Jolaine Click, [facility], Phone: [phone]  PCP:  Dr Wyvonne [facility], Phone: [phone]  ?  REPRODUCTIVE HISTORY:   Age at frist menstrual cycle: 75  Age of first live birth: 69  Number of live births: 3  Number of pregnancies: 3  Breastfeeding: yes; total of 33 months.  Age at menopause: (uterus and ovaries intact); age 31   Oral Contraceptive Use:  No  Hormone Replacement Therapy (HRT) Use:  Patient describes she does not know the name of the HRT she took age 79 to 10.     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   ?    Family History   Problem Relation Age of Onset   ? Hypertension Mother    ? High Cholesterol Mother    ? Arthritis-osteo Mother         Bilateral knee/hip replacements   ? Osteoporosis Mother    ? Atrial Fibrillation Mother    ? Cancer-Skin Mother    ? Hypertension Father    ? High Cholesterol Father    ? Cancer-Skin Father    ? Cancer-Breast Sister 60   ? Arthritis-rheumatoid Sister    ? Crohn's Disease Sister    ? Cancer-Hematologic Sister 49        Lymphoma treated with radiation   ? None Reported Daughter    ? None Reported Daughter         Married 08/2017   ? None Reported Daughter         Married    ? Asthma Sister    ? None Reported Grandchild    ? Cancer-Skin Paternal Uncle    ? Cancer-Lung Maternal Aunt    ? Cancer-Breast Other         MGA   ? None Reported Grandchild         due date 06/23/20   ? None Reported Grandchild         due date 06/23/20       Previous Genetic Testing: (Self, Family)  Self/Family Exact Results/Specific Mutations/Variances   None ?   ?  GENETIC TESTING:   09/17/2019 [facility] Multi-Cancer Panel Test: no mutation.   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 Remains variant of uncertain significance.    HISTORY OF PRESENT ILLNESS:   Patient returns to the [facility] today for continued surveillance. She is at risk of the development of breast cancer due to a personal history of atypical ductal hyperplasia and FH (sister) of breast cancer. She was referred to my clinic by [name], PA-C, [facility].  She is feeling well, denies changes at this visit.  Review of Systems  No unusual fatigue or distress at this visit.   Traveled to [place] (03/2017; riverboat cruise from [place]).   Retired 05/02/2018; [name] and moved to [place] (new model house - reverse flip).   Husband retired 11/2017. Married; [name] x 40 years (07/2020)  Miranda Cuevas and Miranda Cuevas)  Twin grandchildren 06/07/2020; boy Miranda Cuevas) Norwood Levo and girl (Miranda Marie)l Miranda Cuevas and Miranda Cuevas.   Expecting another grandchild in 03/2021.   COVID vaccination completed (12/22/2019 and 01/12/2020; Pfizer).   COVID booster 08/12/2020; [employer].  Dr. [name] (sinus surgery) on 01/2019 due to recurring sinus infections.  Weight (BMI 32.89; # 193 <- 32.25; # 206 <- 31.32; # 200 <- 30.7; # 196 <- 31.17; # 199)   Started [facility] 03/2020. Very excited about a structured weight loss program.   09/17/2019 [facility] Multi-Cancer Panel Test: no mutation.   VUS; BARD1 c.542A>G (p.Glu181Gly)  Sister Miranda Cuevas) diagnosed with HER-2 + breast cancer (03/2017); still doing well.   Granddaughter Miranda Cuevas) had a baby Miranda (12/05/2018). They live in [place], [place].  Granddaughter 04/2019 Miranda Cuevas; daughter Miranda Cuevas). They live in [place], [place]. Expecting another baby.   Grandson (Will; age 52 lives in the [facility] area)  No fever or chills.   No headache or dizziness.   No SOB or cough.   GERD - on Prilosec (90 days at a time)  EGD (2016)  No chest pain or palpations.  Diagnosed with asthma this spring.   No current therapy for HTN.   Currently on medication for hyperlipidemia.  No family history of cardiac disease.   Mother (A-Fib; age 87), still living.   No breast changes.   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    History of (arthritic bone removal; 10/03/2017 on the left). Right surgery 01/02/2018 [facility]; plastic surgery).   May have more knee surgery in 2020.   No nausea, vomiting.   Issues with bowel function since 03/2017 (alternating diarrhea and constipation)  No dysuria or hematochezia.   Colonoscopy (09/01/2019); normal (diverticulitis[facility].  No FH of colon cancer  LMP: uterus and ovaries intact (age 89); HRT x 10 years (stopped 2015 with diagnosis of atypia).   Rare hot flashes (> 20 years); has been on Prozac, now on 20mg  daily.   No vaginal dryness; not currently sexually active.   No rash, ulcerations of the skin or changing moles.   No FH of melanoma.   Patient  reports that she has never smoked. She has never used smokeless tobacco.  Patient  reports current alcohol use of about 3.0 - 5.0 standard drinks of alcohol per week.  Current exercise:   Bone density test (~ 5 years ago at [facility]); thinks the test was normal.   Long history of depression; takes Prozac. (refills through Dr. Chilton Si; endocrinologist).       Objective:         ? albuterol sulfate (PROAIR HFA) 90 mcg/actuation HFA aerosol inhaler INHALE 2 PUFFS BY MOUTH EVERY 4 HOURS   ? CALCIUM CARBONATE/VITAMIN D2 (CALCIUM + VITAMIN D PO) Take 1 tablet by mouth daily. 600 mg   ? DOCOSAHEXANOIC ACID/EPA (FISH OIL PO) Take 520 mg by mouth daily.   ? FLUoxetine (PROZAC) 20 mg capsule Take 1 capsule by mouth daily.   ? fluticasone-salmeterol (ADVAIR DISKUS) 250-50 mcg/dose inhalation disk Inhale 2 puffs by mouth into the lungs daily as needed.   ? guaifenesin (MUCINEX PO) Take 1 tablet by mouth daily.   ?  KETOTIFEN FUMARATE (ZADITOR OP) Place  into or around eye(s).   ? levocetirizine dihydrochloride (XYZAL PO) Take  by mouth daily.   ? NAPROXEN SODIUM (ALEVE PO) Take  by mouth daily.   ? omeprazole (PRILOSEC PO) Take  by mouth.   ? pravastatin (PRAVACHOL) 20 mg tablet Take 20 mg by mouth at bedtime daily.   ? SYNTHROID 112 mcg tablet Take 112 mcg by mouth daily.   ? TRIAMCINOLONE ACETONIDE (NASACORT NA) Apply  into nose as directed daily.     Vitals:    09/19/20 0930   BP: 119/65   BP Source: Arm, Left Upper   Patient Position: Sitting   Pulse: 63   Resp: 16   Temp: 36.4 ?C (97.6 ?F)   TempSrc: Oral   SpO2: 100%   Weight: 87.8 kg (193 lb 9.6 oz)   Height: 163.4 cm (64.33)   PainSc: Zero     Body mass index is 32.89 kg/m?Marland Kitchen     Pain Score: Zero     Pain Addressed:  N/A    Patient Evaluated for a Clinical Trial: No treatment clinical trial available for this patient.     [place] Cooperative Oncology Group performance status is Not applicable. Marland Kitchen     Physical Exam  Vitals reviewed.   Chest:       Lymphadenopathy:      Cervical: No cervical adenopathy.      Upper Body:      Right upper body: No supraclavicular adenopathy.      Left upper body: No supraclavicular adenopathy.     This is a 64 year old female in no acute distress, well developed.   09/17/2019 Invitae Multi-Cancer Panel Test: no mutation.   VUS; BARD1 c.542A>G (p.Glu181Gly)  HEENT: No icterus.   Neck: No JVD, supple.   Chest: CTA bilaterally.   CV: RRR without murmur.  Abdomen: Soft, non-distended, non-tender, positive bowel sounds, no organomegaly.   Skin: No rash or suspicious appearing moles  Extremities: No clubbing, cyanosis or edema.   Back: No pain with palpation or percussion.   Neuro: CN II-XII grossly intact; no sensory or motor abnormalities noted.          Breast Health History & Imaging:  Date Test Results   01/02/13 Bilateral Screening Mammogram BI-RADS 1   04/06/14 Bilateral Screening Mammogram BI-RADS 0   04/06/14 Right DX Mammogram BI-RADS 4   04/08/14 Right Breast Needle BX Final Diagnosis:   A. Breast, right 10 o'clock with calcs breast, needle biopsy: Very focal atypical ductal hyperplasia with intraluminal microcalcifications and columnar cell changes. See comment.   B. Breast, right 10 o'clock no calcs breast, needle biopsy: Benign breast tissue. There is no evidence of malignancy.   Comment: Radiologic correlation for residual lesion is indicated.   06/10/14  Right Breast Lumpectomy Final Diagnosis:  A. Breast, right breast lumpectomy: ? ? Fibrocystic changes with usual ductal hyperplasia, cysts, apocrine metaplasia, and columnar cell changes. Changes consistent with previous biopsy site. There is no evidence of malignancy.        12/17/14 Bilateral DX Mammogram BI-RADS 2   12/22/15 Bilateral Screening Mammogram BI-RADS 1   01/09/17 Bilateral DX Mammogram BI-RADS 2   09/03/17 Right DX Mammogram  Right Breast US BI-RADS 1 (Overall)     01/14/2018 BILATERAL MAMMOGRAM (TOMO):  ACR BI-RADS? Assessments: BIRAD 2-Benign  07/18/2018 BREAST MRI: ACR BI-RADS? Assessments: BIRAD 1-Negative  03/26/2019 BILATERAL MAMMOGRAM (TOMO):  ASSESSMENT: BIRAD 2-Benign   09/17/2019 BREAST MRI: ASSESSMENT: Terance Hart  1-Negative   03/31/2020 BILATERAL MAMMOGRAM (TOMO):  ASSESSMENT: BIRAD 2-Benign  09/19/2020 BREAST MRI:  ASSESSMENT: BIRAD 2-Benign     BONE IMAGING:   12/23/2017 BMD:  Normal   04/2019 BMD: [facility] (normal)    LAB:   12/04/2017 CMP:  WNL         Breast Cancer Risk Assessment    Acute issues addressed today: Elevated risk for breast cancer development      Risk Assessment    Dayna Barker - this includes assessment based on your age, reproductive history and first degree relatives with breast cancer and your personal history of breast biopsies.    Your 5 year risk of developing breast cancer (calculated at 64 y.o.): 8.4 %  Average women?s risk:  1.8 %    Your lifetime risk of developing breast cancer (to age 94): 34.2 %   Average women?s risk:  8.8 %    Tyrer-Cuzick Model ? this model includes a more extensive assessment of risk factors, including age, height, weight, reproductive history, benign breast lesions, history of hormone replacement therapy use and a more extensive evaluation of your family history.     Your 10 year risk of developing breast cancer (calculated at 64 y.o.): 15.4 %  Average women?s risk: 3.6 %    Your lifetime risk of developing breast cancer: 33.3 %  Average women?s risk: 8.7 %         Breast Cancer Screening Recommendations:    In addition to a clinical breast exam every 6 months, our screening imaging recommendations are:  Mammogram: 12 months   Include tomosynthesis (3D) if available?  yes    Breast MRI: 12 months           Prevention recommendations:    - Maintain a healthy body weight.    - Exercise regularly.  - Alcohol in moderation (less than 1 drink per day).         1. History of ADH (2015) and FH of breast cancer (sister and MGAunt).   2. Genetic testing. 09/17/2019 [facility]ae Multi-Cancer Panel Test: no mutation. VUS; BARD1 c.542A>G (p.Glu181Gly).   3. Short term risk: Patient did try raloxifene for approximately 6 weeks; discontinued due to severe side effects of hot flashes.   4. Long term risk: Alternating breast imaging every 6 months (mammogram (TOMO) MRI due to lifetime risk > 20%) with clinical breast examination.   5. I have reviewed the distress thermometer and needs assessment, including physical, psychological, social, spiritual and behavioral needs.  The patient completed the assessment during this visit to identify psychosocial needs that may interfere with the patient's plan of care.  I have made the following referrals based on this assessment and discussion with the patient:  Need not indicated.  6. RTC 6 months with bilateral mammogram and then 12 months with breast MRI    Total Time Today was 30 minutes in the following activities: Preparing to see the patient, Obtaining and/or reviewing separately obtained history, Performing a medically appropriate examination and/or evaluation, Counseling and educating the patient/family/caregiver, Ordering medications, tests, or procedures, Referring and communication with other health care professionals (when not separately reported), Documenting clinical information in the electronic or other health record, Independently interpreting results (not separately reported) and communicating results to the patient/family/caregiver and Care coordination (not separately reported)    Collaborating physician: Joni Reining, MD  NPI # [id number]    Sharia Reeve, APRN-NP

## 2020-09-16 ENCOUNTER — Encounter: Admit: 2020-09-16 | Discharge: 2020-09-16 | Payer: BC Managed Care – PPO

## 2020-09-16 DIAGNOSIS — E039 Hypothyroidism, unspecified: Secondary | ICD-10-CM

## 2020-09-16 DIAGNOSIS — M1810 Unilateral primary osteoarthritis of first carpometacarpal joint, unspecified hand: Secondary | ICD-10-CM

## 2020-09-16 DIAGNOSIS — M549 Dorsalgia, unspecified: Secondary | ICD-10-CM

## 2020-09-16 DIAGNOSIS — M1990 Unspecified osteoarthritis, unspecified site: Secondary | ICD-10-CM

## 2020-09-16 DIAGNOSIS — Z1379 Encounter for other screening for genetic and chromosomal anomalies: Secondary | ICD-10-CM

## 2020-09-16 DIAGNOSIS — N6091 Unspecified benign mammary dysplasia of right breast: Secondary | ICD-10-CM

## 2020-09-19 ENCOUNTER — Encounter: Admit: 2020-09-19 | Discharge: 2020-09-19 | Payer: BC Managed Care – PPO | Primary: Family

## 2020-09-19 ENCOUNTER — Ambulatory Visit: Admit: 2020-09-19 | Discharge: 2020-09-19 | Payer: BC Managed Care – PPO | Primary: Family

## 2020-09-19 DIAGNOSIS — M1810 Unilateral primary osteoarthritis of first carpometacarpal joint, unspecified hand: Secondary | ICD-10-CM

## 2020-09-19 DIAGNOSIS — Z803 Family history of malignant neoplasm of breast: Secondary | ICD-10-CM

## 2020-09-19 DIAGNOSIS — E039 Hypothyroidism, unspecified: Secondary | ICD-10-CM

## 2020-09-19 DIAGNOSIS — Z9189 Other specified personal risk factors, not elsewhere classified: Secondary | ICD-10-CM

## 2020-09-19 DIAGNOSIS — Z1231 Encounter for screening mammogram for malignant neoplasm of breast: Secondary | ICD-10-CM

## 2020-09-19 DIAGNOSIS — M1990 Unspecified osteoarthritis, unspecified site: Secondary | ICD-10-CM

## 2020-09-19 DIAGNOSIS — N6091 Unspecified benign mammary dysplasia of right breast: Secondary | ICD-10-CM

## 2020-09-19 DIAGNOSIS — Z1239 Encounter for other screening for malignant neoplasm of breast: Secondary | ICD-10-CM

## 2020-09-19 DIAGNOSIS — Z79899 Other long term (current) drug therapy: Secondary | ICD-10-CM

## 2020-09-19 DIAGNOSIS — E559 Vitamin D deficiency, unspecified: Secondary | ICD-10-CM

## 2020-09-19 DIAGNOSIS — Z1379 Encounter for other screening for genetic and chromosomal anomalies: Secondary | ICD-10-CM

## 2020-09-19 DIAGNOSIS — M549 Dorsalgia, unspecified: Secondary | ICD-10-CM

## 2020-09-19 LAB — 25-OH VITAMIN D (D2 + D3): Lab: 35 ng/mL (ref 30–80)

## 2020-09-19 MED ORDER — SODIUM CHLORIDE 0.9 % IJ SOLN
50 mL | Freq: Once | INTRAVENOUS | 0 refills | Status: CP
Start: 2020-09-19 — End: ?
  Administered 2020-09-19: 15:00:00 50 mL via INTRAVENOUS

## 2020-09-19 MED ORDER — GADOBENATE DIMEGLUMINE 529 MG/ML (0.1MMOL/0.2ML) IV SOLN
20 mL | Freq: Once | INTRAVENOUS | 0 refills | Status: CP
Start: 2020-09-19 — End: ?
  Administered 2020-09-19: 15:00:00 20 mL via INTRAVENOUS

## 2020-09-19 NOTE — Progress Notes
Normal breast MRI results released to MyChart and copy forwarded to PCP

## 2021-04-04 ENCOUNTER — Encounter: Admit: 2021-04-04 | Discharge: 2021-04-04 | Payer: MEDICARE | Primary: Family

## 2021-04-04 NOTE — Progress Notes
Name: Miranda Cuevas          MRN: [id number]      DOB: 11-15-1955      AGE: 65 y.o.   DATE OF SERVICE: 04/07/2021    Subjective:             Reason for Visit:  Breast Problem      Miranda Cuevas is a 65 y.o. female.     DIAGNOSIS:  Right Breast Atypical Ductal Hyperplasia (2015), Family HX of Breast Cancer    ?  PHYSICIAN INFORMATION:    Referring Provider:  Melburn Popper, [facility]   OB/GYN:  Dr Jolaine Click, [facility], Phone: [phone]  PCP:  Dr Wyvonne L[facility], Phone: [phone]  ?  REPRODUCTIVE HISTORY:   Age at frist menstrual cycle: 21  Age of first live birth: 39  Number of live births: 3  Number of pregnancies: 3  Breastfeeding: yes; total of 33 months.  Age at menopause: (uterus and ovaries intact); age 33   Oral Contraceptive Use:  No  Hormone Replacement Therapy (HRT) Use:  Patient describes she does not know the name of the HRT she took age 76 to 89.     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   ?    Family History   Problem Relation Age of Onset   ? Hypertension Mother    ? High Cholesterol Mother    ? Arthritis-osteo Mother         Bilateral knee/hip replacements   ? Osteoporosis Mother    ? Atrial Fibrillation Mother    ? Cancer-Skin Mother    ? Hypertension Father    ? High Cholesterol Father    ? Cancer-Skin Father    ? Cancer-Breast Sister 60   ? Arthritis-rheumatoid Sister    ? Crohn's Disease Sister    ? Cancer-Hematologic Sister 70        Lymphoma treated with radiation   ? None Reported Daughter    ? None Reported Daughter         Married 08/2017   ? None Reported Daughter         Married    ? Asthma Sister    ? None Reported Grandchild    ? Cancer-Skin Paternal Uncle    ? Cancer-Lung Maternal Aunt    ? Cancer-Breast Other         MGA   ? None Reported Grandchild         due date 06/23/20   ? None Reported Grandchild         due date 06/23/20     GENETIC TESTING:   09/17/2019 [facility] Multi-Cancer Panel Test: no mutation. VUS; BARD1 c.542A>G (p.Glu181Gly)    HISTORY OF PRESENT ILLNESS:   Patient returns to the [facility] Cancer Prevention Clinic today for continued surveillance. She is at risk of the development of breast cancer due to a personal history of atypical ductal hyperplasia and FH (sister) of breast cancer. She was referred to my clinic by [name], PA-C, [place] [facility].  She is feeling well, denies changes at this visit.       Review of Systems  No unusual fatigue or distress at this visit.   Traveled to [place] (03/2017; riverboat cruise from [place]).   Retired 05/02/2018; [name] and moved to [place] (new model house -  reverse flip).   Husband retired 11/2017. Married; [name] x 40 years (07/2020)  COVID vaccination completed (12/22/2019 and 01/12/2020; Pfizer).   COVID booster 08/12/2020; [employer].  Dr. [name] (sinus surgery) on 01/2019 due to recurring sinus infections.  Weight (BMI 33.38; # 196 <- 32.89; # 193 <- 32.25; # 206 <- 31.32; # 200 <- 30.7; # 196 <- 31.17; # 199)   Started [facility] 03/2020. Very excited about a structured weight loss program.   09/17/2019 [facility]ae Multi-Cancer Panel Test: no mutation.   VUS; BARD1 c.542A>G (p.Glu181Gly)  Sister Miranda Cuevas) diagnosed with HER-2 + breast cancer (03/2017); still doing well.   Granddaughter Miranda Cuevas) had a baby Miranda Cuevas (12/05/2018). They live in [place], [place]. Twin grandchildren 06/07/2020; boy Miranda Cuevas) Norwood Levo and girl (Miranda Cuevas)l Miranda Cuevas and Miranda Cuevas.   Granddaughter 04/2019 Miranda Cuevas; daughter Miranda Cuevas). They live in [place], [place]. Expecting another baby.   Grandson (Miranda Cuevas; age 39 lives in the [facility] area)  No fever or chills.   No headache or dizziness.   No SOB or cough.   GERD - on Prilosec (90 days at a time)  No chest pain or palpations.  Diagnosed with asthma (2021); using RX.   No current therapy for HTN.   Currently on medication for hyperlipidemia.  No family history of cardiac disease.   Mother (A-Fib; age 45), still living.   No breast changes.   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    History of (arthritic bone removal; 10/03/2017 on the left). Right surgery 01/02/2018 [facility]; plastic surgery).   May have more knee surgery in 2020.   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 Becoming a real problem with daily pain.  No nausea, vomiting. Issues with bowel function since 03/2017 (alternating diarrhea and constipation)  No dysuria or hematochezia.   Colonoscopy (09/01/2019); normal (diverticulitis); [facility].  No FH of colon cancer  LMP: uterus and ovaries intact (age 8); HRT x 10 years (stopped 2015 with diagnosis of atypia).   Rare hot flashes (> 20 years); has been on Prozac, now on 20mg  daily.   No vaginal dryness; not currently sexually active.   No rash, ulcerations of the skin or changing moles.   No FH of melanoma.   Patient  reports that she has never smoked. She has never used smokeless tobacco.  Patient  reports current alcohol use of about 3.0 - 5.0 standard drinks of alcohol per week.  Current exercise:   Bone density test (~ 5 years ago at [facility]); thinks the test was normal.   Long history of depression; takes Prozac. (refills through Dr. Chilton Si; endocrinologist).       Objective:         ? CALCIUM CARBONATE/VITAMIN D2 (CALCIUM + VITAMIN D PO) Take 1 tablet by mouth daily. 600 mg   ? Cholecalciferol (Vitamin D3) 50 mcg (2,000 unit) capsule Take 2,000 Units by mouth daily.   ? DOCOSAHEXANOIC ACID/EPA (FISH OIL PO) Take 520 mg by mouth daily.   ? FLUoxetine (PROZAC) 20 mg capsule Take 1 capsule by mouth daily.   ? fluticasone-salmeterol (ADVAIR DISKUS) 250-50 mcg/dose inhalation disk Inhale 2 puffs by mouth into the lungs daily as needed.   ? guaifenesin (MUCINEX PO) Take 1 tablet by mouth daily.   ? KETOTIFEN FUMARATE (ZADITOR OP) Place  into or around eye(s).   ? levocetirizine dihydrochloride (XYZAL PO) Take  by mouth daily.   ? NAPROXEN SODIUM (ALEVE PO) Take  by mouth daily.   ?  omeprazole (PRILOSEC PO) Take  by mouth.   ? pravastatin (PRAVACHOL) 20 mg tablet Take 20 mg by mouth at bedtime daily.   ? SYNTHROID 112 mcg tablet Take 112 mcg by mouth daily.   ? TRIAMCINOLONE ACETONIDE (NASACORT NA) Apply  into nose as directed daily.     Vitals:    04/07/21 0841   BP: 122/58   BP Source: Arm, Left Upper   Pulse: 64   Temp: 36.7 ?C (98.1 ?F)   Resp: 16   SpO2: 99%   TempSrc: Oral   PainSc: Zero   Weight: 88.9 kg (196 lb)  Comment: without shoes on   Height: 163.2 cm (5' 4.25)  Comment: without shoes on     Body mass index is 33.38 kg/m?Marland Kitchen     Pain Score: Zero     Pain Addressed:  N/A    Patient Evaluated for a Clinical Trial: No treatment clinical trial available for this patient.     [place] Cooperative Oncology Group performance status is Not applicable. Marland Kitchen     Physical Exam  Vitals reviewed.   Chest:   Breasts:      Right: No supraclavicular adenopathy.      Left: No supraclavicular adenopathy.         Lymphadenopathy:      Cervical: No cervical adenopathy.      Upper Body:      Right upper body: No supraclavicular adenopathy.      Left upper body: No supraclavicular adenopathy.     This is a 65 year old female in no acute distress, well developed.   09/17/2019 Inv[facility]ae Multi-Cancer Panel Test: no mutation.   VUS; BARD1 c.542A>G (p.Glu181Gly)  HEENT: No icterus.   Neck: No JVD, supple.   Chest: CTA bilaterally.   CV: RRR without murmur.  Abdomen: Soft, non-distended, non-tender, positive bowel sounds, no organomegaly.   Skin: No rash or suspicious appearing moles  Extremities: No clubbing, cyanosis or edema.   Back: No pain with palpation or percussion.   Neuro: CN II-XII grossly intact; no sensory or motor abnormalities noted.          Breast Health History & Imaging:  Date Test Results   01/02/13 Bilateral Screening Mammogram BI-RADS 1   04/06/14 Bilateral Screening Mammogram BI-RADS 0   04/06/14 Right DX Mammogram BI-RADS 4   04/08/14 Right Breast Needle BX Final Diagnosis:   A. Breast, right 10 o'clock with calcs breast, needle biopsy: Very focal atypical ductal hyperplasia with intraluminal microcalcifications and columnar cell changes. See comment.   B. Breast, right 10 o'clock no calcs breast, needle biopsy: Benign breast tissue. There is no evidence of malignancy.   Comment: Radiologic correlation for residual lesion is indicated.   06/10/14  Right Breast Lumpectomy Final Diagnosis:  A. Breast, right breast lumpectomy: ? ? Fibrocystic changes with usual ductal hyperplasia, cysts, apocrine metaplasia, and columnar cell changes. Changes consistent with previous biopsy site. There is no evidence of malignancy.        12/17/14 Bilateral DX Mammogram BI-RADS 2   12/22/15 Bilateral Screening Mammogram BI-RADS 1   01/09/17 Bilateral DX Mammogram BI-RADS 2   09/03/17 Right DX Mammogram  Right Breast US BI-RADS 1 (Overall)     01/14/2018 BILATERAL MAMMOGRAM (TOMO):  ACR BI-RADS? Assessments: BIRAD 2-Benign  07/18/2018 BREAST MRI: ACR BI-RADS? Assessments: BIRAD 1-Negative  03/26/2019 BILATERAL MAMMOGRAM (TOMO):  ASSESSMENT: BIRAD 2-Benign   09/17/2019 BREAST MRI: ASSESSMENT: BIRAD 1-Negative   03/31/2020 BILATERAL MAMMOGRAM (TOMO):  ASSESSMENT: BIRAD  2-Benign  09/19/2020 BREAST MRI:  ASSESSMENT: BIRAD 2-Benign   04/07/2021 BILATERAL MAMMOGRAM (TOMO): ASSESSMENT: BIRAD 1-Negative       BONE IMAGING:   12/23/2017 BMD:  Normal   04/2019 BMD: [facility] (normal)    LAB:   12/04/2017 CMP:  WNL         Breast Cancer Risk Assessment    Acute issues addressed today: Elevated risk for breast cancer development      Risk Assessment    Dayna Barker - this includes assessment based on your age, reproductive history and first degree relatives with breast cancer and your personal history of breast biopsies.    Your 5 year risk of developing breast cancer (calculated at 65 y.o.): 8.4 %  Average women?s risk:  1.8 %    Your lifetime risk of developing breast cancer (to age 68): 34.2 %   Average women?s risk:  8.8 %    Tyrer-Cuzick Model - this model includes a more extensive assessment of risk factors, including age, height, weight, reproductive history, benign breast lesions, history of hormone replacement therapy use and a more extensive evaluation of your family history.     Your 10 year risk of developing breast cancer (calculated at 65 y.o.): 15.4 %  Average women?s risk: 3.6 %    Your lifetime risk of developing breast cancer: 33.3 %  Average women?s risk: 8.7 %         Breast Cancer Screening Recommendations:    In addition to a clinical breast exam every 6 months, our screening imaging recommendations are:  Mammogram: 12 months   Include tomosynthesis (3D) if available?  yes    Breast MRI: 12 months           Prevention recommendations:    - Maintain a healthy body weight.    - Exercise regularly.  - Alcohol in moderation (less than 1 drink per day).         1. History of ADH (2015) and FH of breast cancer (sister and MGAunt).   2. Genetic testing. 09/17/2019 [facility]ae Multi-Cancer Panel Test: no mutation. VUS; BARD1 c.542A>G (p.Glu181Gly).   3. Short term risk: Patient did try raloxifene for approximately 6 weeks; discontinued due to severe side effects of hot flashes.   4. Long term risk: Alternating breast imaging every 6 months (mammogram (TOMO) MRI due to lifetime risk > 20%) with clinical breast examination.   5. RTC 09/07/2021 with breast MRI and then 12 months with bilateral mammogram.    Total Time Today was 30 minutes in the following activities: Preparing to see the patient, Obtaining and/or reviewing separately obtained history, Performing a medically appropriate examination and/or evaluation, Counseling and educating the patient/family/caregiver, Ordering medications, tests, or procedures, Referring and communication with other health care professionals (when not separately reported), Documenting clinical information in the electronic or other health record, Independently interpreting results (not separately reported) and communicating results to the patient/family/caregiver and Care coordination (not separately reported)    Collaborating physician: Joni Reining, MD  NPI # [id number]    Sharia Reeve, APRN-NP

## 2021-04-07 ENCOUNTER — Encounter: Admit: 2021-04-07 | Discharge: 2021-04-07 | Payer: MEDICARE | Primary: Family

## 2021-04-07 DIAGNOSIS — M1810 Unilateral primary osteoarthritis of first carpometacarpal joint, unspecified hand: Secondary | ICD-10-CM

## 2021-04-07 DIAGNOSIS — Z9189 Other specified personal risk factors, not elsewhere classified: Secondary | ICD-10-CM

## 2021-04-07 DIAGNOSIS — Z1231 Encounter for screening mammogram for malignant neoplasm of breast: Secondary | ICD-10-CM

## 2021-04-07 DIAGNOSIS — M549 Dorsalgia, unspecified: Secondary | ICD-10-CM

## 2021-04-07 DIAGNOSIS — Z1239 Encounter for other screening for malignant neoplasm of breast: Secondary | ICD-10-CM

## 2021-04-07 DIAGNOSIS — Z803 Family history of malignant neoplasm of breast: Secondary | ICD-10-CM

## 2021-04-07 DIAGNOSIS — M1990 Unspecified osteoarthritis, unspecified site: Secondary | ICD-10-CM

## 2021-04-07 DIAGNOSIS — Z1379 Encounter for other screening for genetic and chromosomal anomalies: Secondary | ICD-10-CM

## 2021-04-07 DIAGNOSIS — N6091 Unspecified benign mammary dysplasia of right breast: Secondary | ICD-10-CM

## 2021-04-07 DIAGNOSIS — E039 Hypothyroidism, unspecified: Secondary | ICD-10-CM

## 2021-04-07 DIAGNOSIS — Z79899 Other long term (current) drug therapy: Secondary | ICD-10-CM

## 2021-04-07 NOTE — Patient Instructions
Batya,     Thank you for coming to see us today.   Please call our office or send a message through MyChart if you have any questions or concerns.                                                                                                                                                           Antonetta Clanton RN, BSN, OCN, CBCN, CN-BN  Clinical Nurse Coordinator for Lori Ranallo, APRN-BC        [facility]   [phone] (nurse phone)  [phone] (URGENT NEED or After-Hours Line - Answered 24/7)  [phone] (fax)      [facility]   Clinic: Monday, Thursday, Friday   Admin: Tuesday  [address]  [place], [place] [address]  Scheduling: [phone]  Fax: [phone]   [facility]   Clinic: Wednesday  [address]  [place], [place] [address]  Scheduling: [phone] (Nicki Nicole Irwin)                        [phone] (Courtney)                        [phone] (Mackenzie Lee)  Fax: [phone] or [phone]

## 2021-09-04 NOTE — Progress Notes
Name: Miranda Cuevas [facility]          MRN: [id number]      DOB: 05/28/1956      AGE: 65 y.o.   DATE OF SERVICE: 09/07/2021    Subjective:             Reason for Visit:  Breast Problem (HRBC FU)    Charde Macfarlane Pain is a 65 y.o. female.     DIAGNOSIS:  Right Breast Atypical Ductal Hyperplasia (2015), Family HX of Breast Cancer    ?  PHYSICIAN INFORMATION:    Referring Provider:  Melburn Popper, [facility]   OB/GYN:  Dr Jolaine Click, [facility], Phone: [phone]  PCP:  Dr Wyvonne Lenz [facility], Phone: [phone]  ?  REPRODUCTIVE HISTORY:   Age at frist menstrual cycle: 58  Age of first live birth: 63  Number of live births: 3  Number of pregnancies: 3  Breastfeeding: yes; total of 33 months.  Age at menopause: (uterus and ovaries intact); age 46   Oral Contraceptive Use:  No  Hormone Replacement Therapy (HRT) Use:  Patient describes she does not know the name of the HRT she took age 68 to 68.     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   ?    Family History   Problem Relation Age of Onset   ? Hypertension Mother    ? High Cholesterol Mother    ? Arthritis-osteo Mother         Bilateral knee/hip replacements   ? Osteoporosis Mother    ? Atrial Fibrillation Mother    ? Cancer-Skin Mother    ? Hypertension Father    ? High Cholesterol Father    ? Cancer-Skin Father    ? Cancer-Breast Sister 46   ? Arthritis-rheumatoid Sister    ? Crohn's Disease Sister    ? Cancer-Hematologic Sister 33        Lymphoma treated with radiation   ? None Reported Daughter    ? None Reported Daughter         Married 08/2017   ? None Reported Daughter         Married    ? Asthma Sister    ? None Reported Grandchild    ? Cancer-Skin Paternal Uncle    ? Cancer-Lung Maternal Aunt    ? Cancer-Breast Other         MGA   ? None Reported Grandchild         due date 06/23/20   ? None Reported Grandchild due date 06/23/20     GENETIC TESTING:   09/17/2019 [facility] Multi-Cancer Panel Test: no mutation. VUS; BARD1 c.542A>G (p.Glu181Gly)    HISTORY OF PRESENT ILLNESS:   Patient returns to the [facility] today for continued surveillance. She is at risk of the development of breast cancer due to a personal history of atypical ductal hyperplasia and FH (sister) of breast cancer. She was referred to my clinic by [name], PA-C, [place] [facility].  She is feeling well, denies changes at this visit.       Review of Systems  No unusual fatigue or distress at this visit.   Retired 05/02/2018; [name] and moved to [place] (new model house - reverse flip).   Husband retired 11/2017. Married; [name] x 40 years (07/2020)  COVID vaccination completed (  12/22/2019 and 01/12/2020; Pfizer).   COVID booster 08/12/2020; Pfizer  COVID bivalent booster 08/06/2021; Moderna.  Flu shot (08/06/2021).   Dr. [name] (sinus surgery) on 01/2019 due to recurring sinus infections.  Weight (BMI 34.47; # 202 <- 33.38; # 196 <- 32.89; # 193 <- 32.25; # 206 <- 31.32; # 200 <- 30.7; # 196)   09/17/2019 [facility] Multi-Cancer Panel Test: no mutation.   VUS; BARD1 c.542A>G (p.Glu181Gly)  Sister [name]) diagnosed with HER-2 + breast cancer (03/2017)l. Doing well (09/07/2021)  Daughter [name] [facility] 12/05/2018 and then [name] and Bard Herbert 06/2020). They live in [place].   Daughter [name] (04/2019; Anneliese; and 05/2021 [facility]).   Daughter [name] has (Will; age 57 lives in the [facility] area)  No fever or chills.   No headache or dizziness.   No SOB or cough.   GERD; has been on Prilosec in the past and will begin a new RX in the near future (PCP).   No chest pain or palpations.  Diagnosed with asthma (2021); using RX.   No current therapy for HTN.   Currently on medication for hyperlipidemia.  No family history of cardiac disease.   Mother (A-Fib; age 37), still living.   No breast changes.   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    History of (arthritic bone removal; 10/03/2017 on the left). Right surgery 01/02/2018 [facility]; plastic surgery).   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 Continues with PT 3x/week.   No nausea, vomiting. Issues with bowel function since 03/2017 (alternating diarrhea and constipation)  No dysuria or hematochezia.   Colonoscopy (09/01/2019); normal (diverticulitis); [facility].  No FH of colon cancer  LMP: uterus and ovaries intact (age 57); HRT x 10 years (stopped 2015 with diagnosis of atypia).   No vaginal dryness; not currently sexually active.   No rash, ulcerations of the skin or changing moles.   Established with a dermatologist [name]).   (2) pre-cancer facial lesions treated (06/2021).   FH of ocular melanoma (mother).   Patient  reports that she has never smoked. She has never used smokeless tobacco.  Patient  reports current alcohol use of about 3.0 - 5.0 standard drinks per week.  Current exercise: none currently.   Bone density test (~ 5 years ago at [facility]); thinks the test was normal.   Long history of depression; takes Prozac. (refills through Dr. Chilton Si; endocrinologist).       Objective:         ? CALCIUM CARBONATE/VITAMIN D2 (CALCIUM + VITAMIN D PO) Take 1 tablet by mouth daily. 600 mg   ? Cholecalciferol (Vitamin D3) 50 mcg (2,000 unit) capsule Take 2,000 Units by mouth daily.   ? DOCOSAHEXANOIC ACID/EPA (FISH OIL PO) Take 520 mg by mouth daily.   ? FLUoxetine (PROZAC) 20 mg capsule Take 1 capsule by mouth daily.   ? KETOTIFEN FUMARATE (ZADITOR OP) Place  into or around eye(s).   ? levocetirizine dihydrochloride (XYZAL PO) Take  by mouth daily.   ? NAPROXEN SODIUM (ALEVE PO) Take  by mouth daily.   ? omeprazole (PRILOSEC PO) Take  by mouth.   ? pravastatin (PRAVACHOL) 20 mg tablet Take 20 mg by mouth at bedtime daily.   ? SYNTHROID 112 mcg tablet Take 112 mcg by mouth daily.   ? TRIAMCINOLONE ACETONIDE (NASACORT NA) Apply  into nose as directed daily.     Vitals:    09/07/21 1326 09/07/21  1331   BP:  127/64   BP Source:  Arm, Right Upper   Pulse:  66   Temp:  36.7 ?C (98 ?F)   Resp:  16   SpO2:  98%   O2 Device:  None (Room air)   TempSrc:  Oral   PainSc: Zero    Weight:  91.8 kg (202 lb 6.4 oz)   Height:  163.2 cm (5' 4.25)     Body mass index is 34.47 kg/m?Marland Kitchen        Pain Addressed:  N/A    Patient Evaluated for a Clinical Trial: No treatment clinical trial available for this patient.     [place] Cooperative Oncology Group performance status is Not applicable. Marland Kitchen     Physical Exam  Vitals reviewed.   Chest:       Lymphadenopathy:      Cervical: No cervical adenopathy.      Upper Body:      Right upper body: No supraclavicular adenopathy.      Left upper body: No supraclavicular adenopathy.     This is a 65 year old female in no acute distress, well developed.   09/17/2019 [facility]ae Multi-Cancer Panel Test: no mutation.   VUS; BARD1 c.542A>G (p.Glu181Gly)  HEENT: No icterus.   Neck: No JVD, supple.   Chest: CTA bilaterally.   CV: RRR without murmur.  Abdomen: Soft, non-distended, non-tender, positive bowel sounds, no organomegaly.   Skin: No rash or suspicious appearing moles  Extremities: No clubbing, cyanosis or edema.   Back: No pain with palpation or percussion.   Neuro: CN II-XII grossly intact; no sensory or motor abnormalities noted.          Breast Health History & Imaging:  Date Test Results   01/02/13 Bilateral Screening Mammogram BI-RADS 1   04/06/14 Bilateral Screening Mammogram BI-RADS 0   04/06/14 Right DX Mammogram BI-RADS 4   04/08/14 Right Breast Needle BX Final Diagnosis:   A. Breast, right 10 o'clock with calcs breast, needle biopsy: Very focal atypical ductal hyperplasia with intraluminal microcalcifications and columnar cell changes. See comment.   B. Breast, right 10 o'clock no calcs breast, needle biopsy: Benign breast tissue. There is no evidence of malignancy.   Comment: Radiologic correlation for residual lesion is indicated.   06/10/14  Right Breast Lumpectomy Final Diagnosis:  A. Breast, right breast lumpectomy: ? ? Fibrocystic changes with usual ductal hyperplasia, cysts, apocrine metaplasia, and columnar cell changes. Changes consistent with previous biopsy site. There is no evidence of malignancy.        12/17/14 Bilateral DX Mammogram BI-RADS 2   12/22/15 Bilateral Screening Mammogram BI-RADS 1   01/09/17 Bilateral DX Mammogram BI-RADS 2   09/03/17 Right DX Mammogram  Right Breast US BI-RADS 1 (Overall)     01/14/2018 BILATERAL MAMMOGRAM (TOMO):  ACR BI-RADS? Assessments: BIRAD 2-Benign  07/18/2018 BREAST MRI: ACR BI-RADS? Assessments: BIRAD 1-Negative  03/26/2019 BILATERAL MAMMOGRAM (TOMO):  ASSESSMENT: BIRAD 2-Benign   09/17/2019 BREAST MRI: ASSESSMENT: BIRAD 1-Negative   03/31/2020 BILATERAL MAMMOGRAM (TOMO):  ASSESSMENT: BIRAD 2-Benign  09/19/2020 BREAST MRI:  ASSESSMENT: BIRAD 2-Benign   04/07/2021 BILATERAL MAMMOGRAM (TOMO): ASSESSMENT: BIRAD 1-Negative   09/07/2021 BREAST MRI: ASSESSMENT: BIRAD 1-Negative       BONE IMAGING:   12/23/2017 BMD:  Normal   04/2019 BMD: [facility] (normal)           Breast Cancer Risk Assessment    Acute issues addressed today: Elevated risk for breast cancer development  Risk Assessment    Dayna Barker - this includes assessment based on your age, reproductive history and first degree relatives with breast cancer and your personal history of breast biopsies.    Your 5 year risk of developing breast cancer (calculated at 65 y.o.): 8.4 %  Average women?s risk:  1.8 %    Your lifetime risk of developing breast cancer (to age 19): 34.2 %   Average women?s risk:  8.8 %    Tyrer-Cuzick Model - this model includes a more extensive assessment of risk factors, including age, height, weight, reproductive history, benign breast lesions, history of hormone replacement therapy use and a more extensive evaluation of your family history.     Your 10 year risk of developing breast cancer (calculated at 65 y.o.): 15.4 %  Average women?s risk: 3.6 %    Your lifetime risk of developing breast cancer: 33.3 %  Average women?s risk: 8.7 %         Breast Cancer Screening Recommendations:    In addition to a clinical breast exam every 6 months, our screening imaging recommendations are:  Mammogram: 12 months   Include tomosynthesis (3D) if available?  yes    Breast MRI: 12 months Screening: Based on her lifetime risk of breast cancer estimated at over 20%, we recommend the following based on NCCN screening guidelines: Clinical exam with clinical breast exam every 6-12 months and annual screening mammogram with tomosynthesis. We also recommend annual breast MRI. We discussed the risks and benefits of breast MRI including increased sensitivity but also increase risk for finding benign disease, increased potential for biopsy and financial out of pocket burden. The abbreviated breast MRI is 10 minutes in length and $500 out of pocket. Counseled on breast awareness including concerning changes of palpating a breast or axillary mass, nipple inversion, nipple discharge or skin changes which should prompt evaluation.           Prevention recommendations:    - Maintain a healthy body weight.    - Exercise regularly.  - Alcohol in moderation (less than 1 drink per day).         1. History of ADH (2015) and FH of breast cancer (sister and MGAunt).   2. Genetic testing. 09/17/2019 [facility]ae Multi-Cancer Panel Test: no mutation. VUS; BARD1 c.542A>G (p.Glu181Gly).   3. Short term risk: Patient did try raloxifene for approximately 6 weeks; discontinued due to severe side effects of hot flashes.   4. Long term risk: Alternating breast imaging every 6 months (mammogram (TOMO) MRI due to lifetime risk > 20%) with clinical breast examination.   5. RTC 04/05/2022 with bilateral mammogram and then 10/2022 with breast MRI.    Total Time Today was 30 minutes in the following activities: Preparing to see the patient, Obtaining and/or reviewing separately obtained history, Performing a medically appropriate examination and/or evaluation, Counseling and educating the patient/family/caregiver, Ordering medications, tests, or procedures, Referring and communication with other health care professionals (when not separately reported), Documenting clinical information in the electronic or other health record, Independently interpreting results (not separately reported) and communicating results to the patient/family/caregiver and Care coordination (not separately reported)    Ernie Hew, APRN-BC, CBCN  Nurse Practitioner  Collaborating physician: Dellia Cloud, MD  NPI # [id number]    Sharia Reeve, APRN-NP

## 2021-09-05 ENCOUNTER — Encounter: Admit: 2021-09-05 | Discharge: 2021-09-05 | Payer: MEDICARE | Primary: Family

## 2021-09-07 ENCOUNTER — Ambulatory Visit: Admit: 2021-09-07 | Discharge: 2021-09-07 | Payer: MEDICARE

## 2021-09-07 ENCOUNTER — Encounter: Admit: 2021-09-07 | Discharge: 2021-09-07 | Payer: MEDICARE

## 2021-09-07 DIAGNOSIS — M1990 Unspecified osteoarthritis, unspecified site: Secondary | ICD-10-CM

## 2021-09-07 DIAGNOSIS — E559 Vitamin D deficiency, unspecified: Secondary | ICD-10-CM

## 2021-09-07 DIAGNOSIS — N6091 Unspecified benign mammary dysplasia of right breast: Secondary | ICD-10-CM

## 2021-09-07 DIAGNOSIS — Z1379 Encounter for other screening for genetic and chromosomal anomalies: Secondary | ICD-10-CM

## 2021-09-07 DIAGNOSIS — E039 Hypothyroidism, unspecified: Secondary | ICD-10-CM

## 2021-09-07 DIAGNOSIS — M1810 Unilateral primary osteoarthritis of first carpometacarpal joint, unspecified hand: Secondary | ICD-10-CM

## 2021-09-07 DIAGNOSIS — Z803 Family history of malignant neoplasm of breast: Secondary | ICD-10-CM

## 2021-09-07 DIAGNOSIS — Z1239 Encounter for other screening for malignant neoplasm of breast: Secondary | ICD-10-CM

## 2021-09-07 DIAGNOSIS — Z9189 Other specified personal risk factors, not elsewhere classified: Secondary | ICD-10-CM

## 2021-09-07 DIAGNOSIS — Z79899 Other long term (current) drug therapy: Secondary | ICD-10-CM

## 2021-09-07 DIAGNOSIS — Z1231 Encounter for screening mammogram for malignant neoplasm of breast: Secondary | ICD-10-CM

## 2021-09-07 DIAGNOSIS — M549 Dorsalgia, unspecified: Secondary | ICD-10-CM

## 2021-09-07 LAB — 25-OH VITAMIN D (D2 + D3): VITAMIN D (25-OH) TOTAL: 47 ng/mL (ref 30–80)

## 2021-09-07 MED ORDER — GADOBENATE DIMEGLUMINE 529 MG/ML (0.1MMOL/0.2ML) IV SOLN
18 mL | Freq: Once | INTRAVENOUS | 0 refills | Status: CP
Start: 2021-09-07 — End: ?
  Administered 2021-09-07: 19:00:00 18 mL via INTRAVENOUS

## 2021-09-07 MED ORDER — SODIUM CHLORIDE 0.9 % IJ SOLN
50 mL | Freq: Once | INTRAVENOUS | 0 refills | Status: CP
Start: 2021-09-07 — End: ?
  Administered 2021-09-07: 19:00:00 50 mL via INTRAVENOUS

## 2021-09-07 NOTE — Progress Notes
Normal breast MRI and normal Vitamin D results released to MyChart and copy forwarded to PCP

## 2022-02-23 ENCOUNTER — Encounter: Admit: 2022-02-23 | Discharge: 2022-02-23 | Payer: MEDICARE

## 2022-03-30 ENCOUNTER — Encounter: Admit: 2022-03-30 | Discharge: 2022-03-30 | Payer: MEDICARE

## 2022-03-30 NOTE — Progress Notes
Name: Miranda Cuevas          MRN: [id number]      DOB: 1956-04-17      AGE: 66 y.o.   DATE OF SERVICE: 04/05/2022    Subjective:             Reason for Visit:  Breast Cancer (HR )    Miranda Cuevas is a 66 y.o. female.     DIAGNOSIS:  Right Breast Atypical Ductal Hyperplasia (2015), Family HX of Breast Cancer    ?  PHYSICIAN INFORMATION:    Referring Provider:  Melburn Cuevas, [facility]   OB/GYN:  Dr [facility] C[facility], [facility], Phone: [phone]  PCP:  Dr [facility] [place] [facility], Phone: [phone]  ?  REPRODUCTIVE HISTORY:   Age at frist menstrual cycle: 89  Age of first live birth: 26  Number of live births: 3  Number of pregnancies: 3  Breastfeeding: yes; total of 33 months.  Age at menopause: (uterus and ovaries intact); age 72   Oral Contraceptive Use:  No  Hormone Replacement Therapy (HRT) Use:  Patient describes she does not know the name of the HRT she took age 8 to 32.     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   ?    Family History   Problem Relation Age of Onset   ? Hypertension Mother    ? High Cholesterol Mother    ? Arthritis-osteo Mother         Bilateral knee/hip replacements   ? Osteoporosis Mother    ? Atrial Fibrillation Mother    ? Cancer-Skin Mother    ? Hypertension Father    ? High Cholesterol Father    ? Cancer-Skin Father    ? Cancer-Breast Sister 62   ? Arthritis-rheumatoid Sister    ? Crohn's Disease Sister    ? Cancer-Hematologic Sister 25        Lymphoma treated with radiation   ? None Reported Daughter    ? None Reported Daughter         Married 08/2017   ? None Reported Daughter         Married    ? Asthma Sister    ? None Reported Grandchild    ? Cancer-Skin Paternal Uncle    ? Cancer-Lung Maternal Aunt    ? Cancer-Breast Other         MGA   ? None Reported Grandchild         due date 06/23/20   ? None Reported Grandchild         due date 06/23/20     GENETIC TESTING:   09/17/2019 [facility] Multi-Cancer Panel Test: no mutation. VUS; BARD1 c.542A>G (p.Glu181Gly)  04/05/2022 Verification of status of VUS through [employer]. Remains variant of uncertain significance.    HISTORY OF PRESENT ILLNESS:   Patient returns to the [facility] Cancer Prevention Clinic today for continued surveillance. She is at risk of the development of breast cancer due to a personal history of atypical ductal hyperplasia and FH (sister) of breast cancer. She was referred to my clinic by Miranda Medina, PA-C, [facility].  She is feeling well, denies changes at this visit.       Review of Systems  No unusual fatigue or distress at this visit.   Retired 05/02/2018; [facility] [employer] and moved  to [place] (new model house - reverse flip).   Husband retired 11/2017. Married; [facility] x 40 years (07/2020)  COVID vaccination completed (12/22/2019 and 01/12/2020; Pfizer).   COVID booster 08/12/2020; Pfizer  COVID bivalent booster 08/06/2021; Moderna.  Flu shot (08/06/2021).   Dr. Elpidio Cuevas (sinus surgery) on 01/2019 due to recurring sinus infections.  Weight (BMI 34.67; # 203 <- 34.47; # 202 <- 33.38; # 196 <- 32.89; # 193 <- 32.25; # 206 <- 31.32; # 200 <- 30.7; # 196)   09/17/2019 [facility] Multi-Cancer Panel Test: no mutation.   VUS; BARD1 c.542A>G (p.Glu181Gly)  Sister [facility]) diagnosed with HER-2 + breast cancer (03/2017)l. Doing well ()  Daughter Miranda C[facility]vas ([facility] C[facility] 12/05/2018 and then [facility] and [facility] 06/2020). They live in [place].   Daughter Miranda Cuevas (04/2019; [facility]; and 05/2021 [facility]).   Daughter Miranda Cuevas has (Miranda Cuevas; age 56 lives in the [facility] area)  No fever or chills.   No headache or dizziness.   No SOB or cough.   GERD; now on pantoprazole (Protonix).    No chest pain or palpations.  Diagnosed with asthma (2021); not using inhaler frequently.   No current therapy for HTN.   Currently on medication for hyperlipidemia.  No family history of cardiac disease.   Mother (A-Fib; age 75), still living.   No breast changes.   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    History of (arthritic bone removal; 10/03/2017 on the left). Right surgery 01/02/2018 [facility]; plastic surgery).   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 Continues with PT 3x/week.   No nausea, vomiting. Issues with bowel function since 03/2017 (alternating diarrhea and constipation)  No dysuria or hematochezia.   Colonoscopy (09/01/2019); normal (diverticulitis); [facility].  No FH of colon cancer  LMP: uterus and ovaries intact (age 39); HRT x 10 years (stopped 2015 with diagnosis of atypia).   No vaginal dryness; not currently sexually active.   No rash, ulcerations of the skin or changing moles.   Established with a dermatologist [facility]).   (2) pre-cancer facial lesions treated (06/2021).   FH of ocular melanoma (mother).   Patient  reports that she has never smoked. She has never used smokeless tobacco.  Patient  reports current alcohol use of about 3.0 - 5.0 standard drinks of alcohol per week.  Current exercise: none currently.   Bone density test (~ 5 years ago at [facility]); thinks the test was normal.   Long history of depression; takes Prozac. (refills through [facility]; endocrinologist).       Objective:         ? CALCIUM CARBONATE/VITAMIN D2 (CALCIUM + VITAMIN D PO) Take 1 tablet by mouth daily. 600 mg   ? Cholecalciferol (Vitamin D3) 50 mcg (2,000 unit) capsule Take one capsule by mouth daily.   ? DOCOSAHEXANOIC ACID/EPA (FISH OIL PO) Take 520 mg by mouth daily.   ? FLUoxetine (PROZAC) 20 mg capsule Take one capsule by mouth daily.   ? KETOTIFEN FUMARATE (ZADITOR OP) Place  into or around eye(s).   ? levocetirizine dihydrochloride (XYZAL PO) Take  by mouth daily.   ? NAPROXEN SODIUM (ALEVE PO) Take  by mouth daily.   ? omeprazole (PRILOSEC PO) Take  by mouth.   ? pantoprazole DR (PROTONIX) 40 mg tablet    ? pravastatin (PRAVACHOL) 20 mg tablet Take one tablet by mouth at bedtime daily.   ? SYNTHROID 112 mcg tablet Take one tablet  by mouth daily.   ? TRIAMCINOLONE ACETONIDE (NASACORT NA) Apply  into nose as directed daily.     Vitals:    04/05/22 0832   BP: 139/78   BP Source: Arm, Right Upper   Pulse: 65   Temp: 36.6 ?C (97.8 ?F)   Resp: 16   SpO2: 98%   TempSrc: Oral   PainSc: Zero   Weight: 92.4 kg (203 lb 9.6 oz)  Comment: without shoes on     Body mass index is 34.67 kg/m?Marland Kitchen        Pain Addressed:  N/A    Patient Evaluated for a Clinical Trial: No treatment clinical trial available for this patient.     [place] Cooperative Oncology Group performance status is Not applicable. Marland Kitchen     Physical Exam  Vitals reviewed.   Chest:       Lymphadenopathy:      Cervical: No cervical adenopathy.      Upper Body:      Right upper body: No supraclavicular adenopathy.      Left upper body: No supraclavicular adenopathy.     This is a 66 year old female in no acute distress, well developed.   09/17/2019 [facility]ae Multi-Cancer Panel Test: no mutation.   VUS; BARD1 c.542A>G (p.Glu181Gly)  HEENT: No icterus.   Neck: No JVD, supple.   Chest: CTA bilaterally.   CV: RRR without murmur.  Abdomen: Soft, non-distended, non-tender, positive bowel sounds, no organomegaly.   Skin: No rash or suspicious appearing moles  Extremities: No clubbing, cyanosis or edema.   Back: No pain with palpation or percussion.   Neuro: CN II-XII grossly intact; no sensory or motor abnormalities noted.          Breast Health History & Imaging:  Date Test Results   01/02/13 Bilateral Screening Mammogram BI-RADS 1   04/06/14 Bilateral Screening Mammogram BI-RADS 0   04/06/14 Right DX Mammogram BI-RADS 4   04/08/14 Right Breast Needle BX Final Diagnosis:   A. Breast, right 10 o'clock with calcs breast, needle biopsy: Very focal atypical ductal hyperplasia with intraluminal microcalcifications and columnar cell changes. See comment.   B. Breast, right 10 o'clock no calcs breast, needle biopsy: Benign breast tissue. There is no evidence of malignancy.   Comment: Radiologic correlation for residual lesion is indicated.   06/10/14  Right Breast Lumpectomy Final Diagnosis:  A. Breast, right breast lumpectomy: ? ? Fibrocystic changes with usual ductal hyperplasia, cysts, apocrine metaplasia, and columnar cell changes. Changes consistent with previous biopsy site. There is no evidence of malignancy.        12/17/14 Bilateral DX Mammogram BI-RADS 2   12/22/15 Bilateral Screening Mammogram BI-RADS 1   01/09/17 Bilateral DX Mammogram BI-RADS 2   09/03/17 Right DX Mammogram  Right Breast US BI-RADS 1 (Overall)     01/14/2018 BILATERAL MAMMOGRAM (TOMO):  ACR BI-RADS? Assessments: BIRAD 2-Benign  07/18/2018 BREAST MRI: ACR BI-RADS? Assessments: BIRAD 1-Negative  03/26/2019 BILATERAL MAMMOGRAM (TOMO):  ASSESSMENT: BIRAD 2-Benign   09/17/2019 BREAST MRI: ASSESSMENT: BIRAD 1-Negative   03/31/2020 BILATERAL MAMMOGRAM (TOMO):  ASSESSMENT: BIRAD 2-Benign  09/19/2020 BREAST MRI:  ASSESSMENT: BIRAD 2-Benign   04/07/2021 BILATERAL MAMMOGRAM (TOMO): ASSESSMENT: BIRAD 1-Negative   09/07/2021 BREAST MRI: ASSESSMENT: BIRAD 1-Negative   04/05/2022 BILATERAL MAMMOGRAM (TOMO):   ASSESSMENT: BIRAD 0-Incomplete: need additional imaging evaluation   RECOMMENDATION: Follow-up diagnostic mammogram and ultrasound of the right breast.   04/05/2022 RIGHT DIAGNOSTIC MAMMOGRAM/RIGHT BREAST ULTRASOUND:       BONE IMAGING:  12/23/2017 BMD:  Normal   04/2019 BMD: [facility] (normal)           Breast Cancer Risk Assessment    Acute issues addressed today: Elevated risk for breast cancer development      Risk Assessment    Dayna Barker - this includes assessment based on your age, reproductive history and first degree relatives with breast cancer and your personal history of breast biopsies.    Your 5 year risk of developing breast cancer (calculated at 66 y.o.): 8.4 %  Average women?s risk:  1.8 %    Your lifetime risk of developing breast cancer (to age 45): 34.2 %   Average women?s risk:  8.8 %    Tyrer-Cuzick Model - this model includes a more extensive assessment of risk factors, including age, height, weight, reproductive history, benign breast lesions, history of hormone replacement therapy use and a more extensive evaluation of your family history.     Your 10 year risk of developing breast cancer (calculated at 66 y.o.): 15.4 %  Average women?s risk: 3.6 %    Your lifetime risk of developing breast cancer: 33.3 %  Average women?s risk: 8.7 %         Breast Cancer Screening Recommendations:    In addition to a clinical breast exam every 6 months, our screening imaging recommendations are:  Mammogram: 12 months   Include tomosynthesis (3D) if available?  yes    Breast MRI: 12 months Screening: Based on her lifetime risk of breast cancer estimated at over 20%, we recommend the following based on NCCN screening guidelines: Clinical exam with clinical breast exam every 6-12 months and annual screening mammogram with tomosynthesis. We also recommend annual breast MRI. We discussed the risks and benefits of breast MRI including increased sensitivity but also increase risk for finding benign disease, increased potential for biopsy and financial out of pocket burden. The abbreviated breast MRI is 10 minutes in length and $500 out of pocket. Counseled on breast awareness including concerning changes of palpating a breast or axillary mass, nipple inversion, nipple discharge or skin changes which should prompt evaluation.           Prevention recommendations:    - Maintain a healthy body weight.    - Exercise regularly.  - Alcohol in moderation (less than 1 drink per day).         1. History of ADH (2015) and FH of breast cancer (sister and MGAunt).   2. Genetic testing. 09/17/2019 [facility]ae Multi-Cancer Panel Test: no mutation. VUS; BARD1 c.542A>G (p.Glu181Gly).   3. Short term risk: Patient did try raloxifene for approximately 6 weeks; discontinued due to severe side effects of hot flashes.   4. Long term risk: Alternating breast imaging every 6 months (mammogram (TOMO) MRI due to lifetime risk > 20%) with clinical breast examination.   5. RTC 10/04/2022 with MRI and then 04/2023 with bilateral mammogram.     Total Time Today was 30 minutes in the following activities: Preparing to see the patient, Obtaining and/or reviewing separately obtained history, Performing a medically appropriate examination and/or evaluation, Counseling and educating the patient/family/caregiver, Ordering medications, tests, or procedures, Referring and communication with other health care professionals (when not separately reported), Documenting clinical information in the electronic or other health [facility], Independently interpreting results (not separately reported) and communicating results to the patient/family/caregiver and Care coordination (not separately reported)    Ernie Hew, APRN-BC, CBCN  Nurse Practitioner  Collaborating physician: Dellia Cloud, MD  NPI #  [id number]      Virginia Rochester, APRN-NP

## 2022-04-05 ENCOUNTER — Encounter: Admit: 2022-04-05 | Discharge: 2022-04-05 | Payer: MEDICARE

## 2022-04-05 DIAGNOSIS — Z9189 Other specified personal risk factors, not elsewhere classified: Secondary | ICD-10-CM

## 2022-04-05 DIAGNOSIS — R928 Other abnormal and inconclusive findings on diagnostic imaging of breast: Secondary | ICD-10-CM

## 2022-04-05 DIAGNOSIS — Z1239 Encounter for other screening for malignant neoplasm of breast: Secondary | ICD-10-CM

## 2022-04-05 DIAGNOSIS — Z1379 Encounter for other screening for genetic and chromosomal anomalies: Secondary | ICD-10-CM

## 2022-04-05 DIAGNOSIS — Z1231 Encounter for screening mammogram for malignant neoplasm of breast: Secondary | ICD-10-CM

## 2022-04-05 DIAGNOSIS — M1990 Unspecified osteoarthritis, unspecified site: Secondary | ICD-10-CM

## 2022-04-05 DIAGNOSIS — E039 Hypothyroidism, unspecified: Secondary | ICD-10-CM

## 2022-04-05 DIAGNOSIS — M549 Dorsalgia, unspecified: Secondary | ICD-10-CM

## 2022-04-05 DIAGNOSIS — N6091 Unspecified benign mammary dysplasia of right breast: Secondary | ICD-10-CM

## 2022-04-05 DIAGNOSIS — M1810 Unilateral primary osteoarthritis of first carpometacarpal joint, unspecified hand: Secondary | ICD-10-CM

## 2022-04-05 DIAGNOSIS — Z803 Family history of malignant neoplasm of breast: Secondary | ICD-10-CM

## 2022-04-05 NOTE — Progress Notes
BIRAD 0 - Right DMAMM recommended. Performed 04/05/2022.   Results released to MyChart and routed to referring physician as requested.

## 2022-04-05 NOTE — Progress Notes
Normal right diagnostic mammogram results released to MyChart and routed to referring physician as requested.

## 2022-04-05 NOTE — Patient Instructions
[facility],     Thank you for coming to see us today.   Please call our office or send a message through MyChart if you have any questions or concerns.                                                                                                                                                           Lorita Forinash RN, BSN, OCN, CBCN, CN-BN  Clinical Nurse Coordinator for Lori Ranallo, APRN-BC        [facility]   [phone] (nurse phone)  [phone] (URGENT NEED or After-Hours Line - Answered 24/7)  [phone] (fax)      [facility]   Clinic: Monday, Thursday, Friday   Admin: Tuesday  [address]  [place], [place] [address]  Scheduling: [phone]  Fax: [phone]   [facility]   Clinic: Wednesday  [address]  [place],  [address]  Scheduling: [phone]                         [phone]                         [phone]   Fax: [phone] or [phone]

## 2022-09-19 ENCOUNTER — Encounter: Admit: 2022-09-19 | Discharge: 2022-09-19 | Payer: MEDICARE

## 2022-10-02 ENCOUNTER — Encounter: Admit: 2022-10-02 | Discharge: 2022-10-02 | Payer: MEDICARE

## 2022-10-19 ENCOUNTER — Encounter: Admit: 2022-10-19 | Discharge: 2022-10-19 | Payer: MEDICARE

## 2022-10-23 ENCOUNTER — Encounter: Admit: 2022-10-23 | Discharge: 2022-10-23 | Payer: MEDICARE

## 2022-10-25 ENCOUNTER — Encounter: Admit: 2022-10-25 | Discharge: 2022-10-25 | Payer: MEDICARE

## 2022-10-25 DIAGNOSIS — M1810 Unilateral primary osteoarthritis of first carpometacarpal joint, unspecified hand: Secondary | ICD-10-CM

## 2022-10-25 DIAGNOSIS — Z9189 Other specified personal risk factors, not elsewhere classified: Secondary | ICD-10-CM

## 2022-10-25 DIAGNOSIS — M549 Dorsalgia, unspecified: Secondary | ICD-10-CM

## 2022-10-25 DIAGNOSIS — N6091 Unspecified benign mammary dysplasia of right breast: Secondary | ICD-10-CM

## 2022-10-25 DIAGNOSIS — Z803 Family history of malignant neoplasm of breast: Secondary | ICD-10-CM

## 2022-10-25 DIAGNOSIS — E039 Hypothyroidism, unspecified: Secondary | ICD-10-CM

## 2022-10-25 DIAGNOSIS — Z79899 Other long term (current) drug therapy: Secondary | ICD-10-CM

## 2022-10-25 DIAGNOSIS — M1990 Unspecified osteoarthritis, unspecified site: Secondary | ICD-10-CM

## 2022-10-25 DIAGNOSIS — Z1239 Encounter for other screening for malignant neoplasm of breast: Secondary | ICD-10-CM

## 2022-10-25 DIAGNOSIS — Z1379 Encounter for other screening for genetic and chromosomal anomalies: Secondary | ICD-10-CM

## 2022-10-25 MED ORDER — GADOBENATE DIMEGLUMINE 529 MG/ML (0.1MMOL/0.2ML) IV SOLN
19 mL | Freq: Once | INTRAVENOUS | 0 refills | Status: CP
Start: 2022-10-25 — End: ?
  Administered 2022-10-25: 23:00:00 19 mL via INTRAVENOUS

## 2022-10-25 MED ORDER — SODIUM CHLORIDE 0.9 % IJ SOLN
50 mL | Freq: Once | INTRAVENOUS | 0 refills | Status: CP
Start: 2022-10-25 — End: ?
  Administered 2022-10-25: 23:00:00 50 mL via INTRAVENOUS

## 2022-10-26 ENCOUNTER — Encounter: Admit: 2022-10-26 | Discharge: 2022-10-26 | Payer: MEDICARE

## 2022-10-26 DIAGNOSIS — M549 Dorsalgia, unspecified: Secondary | ICD-10-CM

## 2022-10-26 DIAGNOSIS — N6091 Unspecified benign mammary dysplasia of right breast: Secondary | ICD-10-CM

## 2022-10-26 DIAGNOSIS — M1990 Unspecified osteoarthritis, unspecified site: Secondary | ICD-10-CM

## 2022-10-26 DIAGNOSIS — E039 Hypothyroidism, unspecified: Secondary | ICD-10-CM

## 2022-10-26 DIAGNOSIS — Z803 Family history of malignant neoplasm of breast: Secondary | ICD-10-CM

## 2022-10-26 DIAGNOSIS — Z9189 Other specified personal risk factors, not elsewhere classified: Secondary | ICD-10-CM

## 2022-10-26 DIAGNOSIS — Z1239 Encounter for other screening for malignant neoplasm of breast: Secondary | ICD-10-CM

## 2022-10-26 DIAGNOSIS — Z1379 Encounter for other screening for genetic and chromosomal anomalies: Secondary | ICD-10-CM

## 2022-10-26 DIAGNOSIS — E559 Vitamin D deficiency, unspecified: Secondary | ICD-10-CM

## 2022-10-26 DIAGNOSIS — M1810 Unilateral primary osteoarthritis of first carpometacarpal joint, unspecified hand: Secondary | ICD-10-CM

## 2022-10-26 NOTE — Patient Instructions
[place],     Thank you for coming to see us today.   Please call our office or send a message through MyChart if you have any questions or concerns.                                                                                                                                                           Correen Bubolz RN, BSN, OCN, CBCN, CN-BN  Clinical Nurse Coordinator for Lori Ranallo, APRN-BC        [facility]   [phone] (nurse phone)  [phone] (URGENT NEED or After-Hours Line - Answered 24/7)  [phone] (fax)      [facility]   Clinic: Monday, Thursday, Friday   Admin: Tuesday  [address]  [place], [place] [address]  Scheduling: [phone]  Fax: [phone]   [facility]   Clinic: Wednesday  [address]  [place], [place] [address]  Scheduling: [phone]                         [phone]                         [phone]   Fax: [phone] or [phone]

## 2023-04-01 ENCOUNTER — Encounter: Admit: 2023-04-01 | Discharge: 2023-04-01 | Payer: MEDICARE

## 2023-04-01 NOTE — Progress Notes
Name: [facility]          MRN: [id number]      DOB: 09-Oct-1955      AGE: 67 y.o.   DATE OF SERVICE: 04/08/2023    Subjective:             Reason for Visit:  Breast Cancer (HR)    Miranda Cuevas is a 67 y.o. female.     DIAGNOSIS:  Right Breast Atypical Ductal Hyperplasia (2015), Family HX of Breast Cancer       PHYSICIAN INFORMATION:    Referring Provider:  Melburn Popper, [facility]   OB/GYN:  Dr Jolaine Click, [facility], Phone: [phone]  PCP:  Dr [facility], Phone: [phone]     REPRODUCTIVE HISTORY:   Age at frist menstrual cycle: 44  Age of first live birth: 41  Number of live births: 3  Number of pregnancies: 3  Breastfeeding: yes; total of 33 months.  Age at menopause: (uterus and ovaries intact); age 67   Oral Contraceptive Use:  No  Hormone Replacement Therapy (HRT) Use:  Patient describes she does not know the name of the HRT she took age 62 to 15.     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        Family History   Problem Relation Name Age of Onset    Hypertension Mother Garwin Brothers Cholesterol Mother [place] Crane Mother Phyllis         Bilateral knee/hip replacements    Osteoporosis Mother Jamesetta So     Atrial Fibrillation Mother Jana Hakim Mother Jamesetta So     Hypertension Father [place]     High Cholesterol Father [place]     Cancer-Skin Father [place] Sister [name] 64    Arthritis-rheumatoid Sister [name]     Crohn's Disease Sister [name]     Cancer-Hematologic Sister [name] 24        Lymphoma treated with radiation    None Reported Daughter [name]     None Reported Daughter [name]         Married 08/2017    None Reported Daughter [name]         Married     Asthma Sister Marjean Donna     None Reported Grandchild Will     Cancer-Skin Paternal Uncle Richard     Cancer-Lung Maternal Aunt [name] Cancer-Breast Other Lucille         MGA    None Reported Sheran Fava         due date 06/23/20    None Reported Grandchild [name]) [place] Levo         due date 06/23/20     GENETIC TESTING:   09/17/2019 [facility] Multi-Cancer Panel Test: no mutation. VUS; BARD1 c.542A>G (p.Glu181Gly)  04/08/2023 Verification of status of VUS through [employer]. Remains variant of uncertain significance.    HISTORY OF PRESENT ILLNESS:   Patient returns to the Breast Cancer Prevention Clinic today for continued surveillance. She is at risk of the development of breast cancer due to a personal history of atypical ductal hyperplasia and FH (sister) of breast cancer. She was referred to my clinic by [name], PA-C, [facility].  She is feeling well, denies changes at this visit.  Review of Systems  No unusual fatigue or distress at this visit.   Retired 05/02/2018; [name] and moved to [place] (new model house - reverse flip).   Husband retired 11/2017. Married; [name] x 40 years (08/13/2020)  Father passed away 2022/08/29 (age 111). Mother age 24 (living at [place]).    COVID vaccination completed (12/22/2019 and 01/12/2020; Pfizer).   COVID booster 02/02/2020; [employer].   COVID booster 08/12/2020; Pfizer  COVID bivalent booster 08/06/2021; Moderna.  COVID booster 09/10/2022; Moderna.   Flu shot (08/06/2021).   Dr. [name] (sinus surgery) on 01/2019 due to recurring sinus infections.  Weight (BMI 35.7; # 209 <- 34.47; # 202 <- 34.67; # 203 <- 34.47; # 202 <- 33.38; # 196 <- 32.89; # 193 <- 32.25; # 206 <- 31.32; # 200 <- 30.7; # 196)   09/17/2019 [facility] Multi-Cancer Panel Test: no mutation.   VUS; BARD1 c.542A>G (p.Glu181Gly)  Sister [name]) diagnosed with HER-2 + breast cancer (03/2017)l. Doing well ()  Daughter [name] (Laurice Record 12/05/2018 and then [name] and Bard Herbert 06/2020). They live in [place].   Daughter [name] (04/2019; Anneliese; and 05/2021 [facility]).   Daughter [name] has (Will; age 11 lives in the [facility] area)  No fever or chills.   No headache or dizziness.   No SOB or cough.   GERD; now on pantoprazole (Protonix).    No chest pain or palpations.  Diagnosed with asthma (2021); not using inhaler frequently.   No current therapy for HTN.   Currently on medication for hyperlipidemia.  No family history of cardiac disease.   Mother (A-Fib; age 54), still living.   No breast changes.   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    History of (arthritic bone removal; 10/03/2017 on the left). Right surgery 01/02/2018 [facility]; plastic surgery).   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   Now left foot (wearing a boot); 04/08/2023.   No nausea, vomiting. Issues with bowel function since 03/2017 (alternating diarrhea and constipation)  No dysuria or hematochezia.   Colonoscopy (09/01/2019); normal (diverticulitis); [facility].  No FH of colon cancer  LMP: uterus and ovaries intact (age 7); HRT x 10 years (stopped 2015 with diagnosis of atypia).   No vaginal dryness; not currently sexually active.   No rash, ulcerations of the skin or changing moles.   Established with a dermatologist [name]).   (2) pre-cancer facial lesions treated (06/2021).   FH of ocular melanoma (mother).   Patient  reports that she has never smoked. She has never used smokeless tobacco.  Patient  reports current alcohol use of about 3.0 - 5.0 standard drinks of alcohol per week.  Current exercise: none currently.   Bone density test (~ 5 years ago at [facility]); thinks the test was normal.   Long history of depression; takes Prozac. (refills through Dr. Chilton Si; endocrinologist).       Objective:          albuterol 0.083% (PROVENTIL) 2.5 mg /3 mL (0.083 %) nebulizer solution USE 3 ML VIA NEBULIZER EVERY 4 TO 6 HOURS AS NEEDED    CALCIUM CARBONATE/VITAMIN D2 (CALCIUM + VITAMIN D PO) Take 1 tablet by mouth daily. 600 mg    Cholecalciferol (Vitamin D3) 50 mcg (2,000 unit) capsule Take one capsule by mouth daily.    cholecalciferol (vitD3)/vit K2 (VITAMIN D3-VITAMIN K2 PO) Take 2 drops by mouth daily.    DOCOSAHEXANOIC ACID/EPA (FISH OIL  PO) Take 520 mg by mouth daily.    FLUoxetine (PROZAC) 20 mg capsule Take one capsule by mouth daily.    fluticasone-salmeterol (WIXELA INHUB) 250-50 mcg/dose inhalation disk INHALE 1 PUFF BY MOUTH TWICE DAILY    gabapentin (NEURONTIN) 300 mg capsule     levocetirizine dihydrochloride (XYZAL PO) Take  by mouth daily.    nabumetone (RELAFEN) 750 mg tablet     NAPROXEN SODIUM (ALEVE PO) Take  by mouth daily.    olopatadine (PATADAY ONCE DAILY RELIEF) 0.7 % ophthalmic drop     pantoprazole DR (PROTONIX) 40 mg tablet     pravastatin (PRAVACHOL) 20 mg tablet Take one tablet by mouth at bedtime daily.    SYNTHROID 112 mcg tablet Take one tablet by mouth daily.    TRIAMCINOLONE ACETONIDE (NASACORT NA) Apply  into nose as directed daily.     Vitals:    04/08/23 1315   BP: (!) 155/88   BP Source: Arm, Right Upper   Pulse: 60   Temp: 36.9 ?C (98.4 ?F)   Resp: 18   SpO2: 100%   TempSrc: Oral   PainSc: Zero   Weight: 95.1 kg (209 lb 9.6 oz)       Body mass index is 35.7 kg/m?Marland Kitchen        Pain Addressed:  N/A    Patient Evaluated for a Clinical Trial: No treatment clinical trial available for this patient.     [place] Cooperative Oncology Group performance status is Not applicable. Marland Kitchen     Physical Exam  Vitals reviewed.   Chest:       Lymphadenopathy:      Cervical: No cervical adenopathy.      Upper Body:      Right upper body: No supraclavicular adenopathy.      Left upper body: No supraclavicular adenopathy.     This is a 68 year old female in no acute distress, well developed.   09/17/2019 [facility]ae Multi-Cancer Panel Test: no mutation.   VUS; BARD1 c.542A>G (p.Glu181Gly)  HEENT: No icterus.   Neck: No JVD, supple.   Chest: CTA bilaterally.   CV: RRR without murmur.  Abdomen: Soft, non-distended, non-tender, positive bowel sounds, no organomegaly.   Skin: No rash or suspicious appearing moles  Extremities: No clubbing, cyanosis or edema.   Back: No pain with palpation or percussion.   Neuro: CN II-XII grossly intact; no sensory or motor abnormalities noted.          Breast Health History & Imaging:  Date Test Results   01/02/13 Bilateral Screening Mammogram BI-RADS 1   04/06/14 Bilateral Screening Mammogram BI-RADS 0   04/06/14 Right DX Mammogram BI-RADS 4   04/08/14 Right Breast Needle BX Final Diagnosis:   A. Breast, right 10 o'clock with calcs breast, needle biopsy: Very focal atypical ductal hyperplasia with intraluminal microcalcifications and columnar cell changes. See comment.   B. Breast, right 10 o'clock no calcs breast, needle biopsy: Benign breast tissue. There is no evidence of malignancy.   Comment: Radiologic correlation for residual lesion is indicated.   06/10/14  Right Breast Lumpectomy Final Diagnosis:  A. Breast, right breast lumpectomy:     Fibrocystic changes with usual ductal hyperplasia, cysts, apocrine metaplasia, and columnar cell changes. Changes consistent with previous biopsy site. There is no evidence of malignancy.        12/17/14 Bilateral DX Mammogram BI-RADS 2   12/22/15 Bilateral Screening Mammogram BI-RADS 1   01/09/17 Bilateral DX Mammogram BI-RADS 2   09/03/17  Right DX Mammogram  Right Breast US BI-RADS 1 (Overall)     01/14/2018 BILATERAL MAMMOGRAM (TOMO):  ACR BI-RADS? Assessments: BIRAD 2-Benign  07/18/2018 BREAST MRI: ACR BI-RADS? Assessments: BIRAD 1-Negative  03/26/2019 BILATERAL MAMMOGRAM (TOMO):  ASSESSMENT: BIRAD 2-Benign   09/17/2019 BREAST MRI: ASSESSMENT: BIRAD 1-Negative   03/31/2020 BILATERAL MAMMOGRAM (TOMO):  ASSESSMENT: BIRAD 2-Benign  09/19/2020 BREAST MRI:  ASSESSMENT: BIRAD 2-Benign   04/07/2021 BILATERAL MAMMOGRAM (TOMO): ASSESSMENT: BIRAD 1-Negative   09/07/2021 BREAST MRI: ASSESSMENT: BIRAD 1-Negative   04/05/2022 BILATERAL MAMMOGRAM (TOMO):   ASSESSMENT: BIRAD 0-Incomplete: need additional imaging evaluation   RECOMMENDATION: Follow-up diagnostic mammogram and ultrasound of the right breast.   04/05/2022 RIGHT DIAGNOSTIC MAMMOGRAM: ASSESSMENT: BIRAD 1-Negative   10/25/2022 BREAST MRI: ASSESSMENT: Overall: 1 - Negative   04/08/2023 BILATERAL MAMMOGRAM (TOMO): ASSESSMENT: Overall: 1 - Negative       BONE IMAGING:   12/23/2017 BMD:  Normal   04/2019 BMD: [facility] (normal)         Breast Cancer Risk Assessment    Acute issues addressed today: Elevated risk for breast cancer development      Risk Assessment    Dayna Barker - this includes assessment based on your age, reproductive history and first degree relatives with breast cancer and your personal history of breast biopsies.    Your 5 year risk of developing breast cancer (calculated at 67 y.o.): 8.4 %  Average women?s risk:  1.8 %    Your lifetime risk of developing breast cancer (to age 33): 34.2 %   Average women?s risk:  8.8 %    Tyrer-Cuzick Model - this model includes a more extensive assessment of risk factors, including age, height, weight, reproductive history, benign breast lesions, history of hormone replacement therapy use and a more extensive evaluation of your family history.     Your 10 year risk of developing breast cancer (calculated at 67 y.o.): 15.4 %  Average women?s risk: 3.6 %    Your lifetime risk of developing breast cancer: 33.3 %  Average women?s risk: 8.7 %         Breast Cancer Screening Recommendations:    In addition to a clinical breast exam every 6 months, our screening imaging recommendations are:  Mammogram: 12 months   Include tomosynthesis (3D) if available?  yes    Breast MRI: 12 months Screening: Based on her lifetime risk of breast cancer estimated at over 20%, we recommend the following based on NCCN screening guidelines: Clinical exam with clinical breast exam every 6-12 months and annual screening mammogram with tomosynthesis. We also recommend annual breast MRI. We discussed the risks and benefits of breast MRI including increased sensitivity but also increase risk for finding benign disease, increased potential for biopsy and financial out of pocket burden. The abbreviated breast MRI is 10 minutes in length. Counseled on breast awareness including concerning changes of palpating a breast or axillary mass, nipple inversion, nipple discharge or skin changes which should prompt evaluation.           Prevention recommendations:    Maintain a healthy body weight.    Exercise regularly.  Alcohol in moderation (less than 1 drink per day).         1. History of ADH (2015) and FH of breast cancer (sister and MGAunt).   2. Genetic testing. 09/17/2019 [facility] Multi-Cancer Panel Test: no mutation. VUS; BARD1 c.542A>G (p.Glu181Gly). 10/26/2022 Verification of status of VUS through [employer]. Remains variant of uncertain significance.  3. Short term risk:  Patient did try raloxifene for approximately 6 weeks; discontinued due to severe side effects of hot flashes.   4. Long term risk: Alternating breast imaging every 6 months (mammogram (TOMO) MRI due to lifetime risk > 20%) with clinical breast examination.   5. RTC 11/01/2023 with MRI and then 03/2024 with bilateral mammogram.       Total Time Today was 40 minutes in the following activities: Preparing to see the patient, Obtaining and/or reviewing separately obtained history, Performing a medically appropriate examination and/or evaluation, Counseling and educating the patient/family/caregiver, Ordering medications, tests, or procedures, Referring and communication with other health care professionals (when not separately reported), Documenting clinical information in the electronic or other health record, Independently interpreting results (not separately reported) and communicating results to the patient/family/caregiver, and Care coordination (not separately reported)      Ernie Hew, APRN-BC, CBCN  Nurse Practitioner  Collaborating physician: Dellia Cloud, MD  NPI # [id number]      Sharia Reeve, APRN-NP

## 2023-04-05 NOTE — Patient Instructions
Miranda Cuevas,     Thank you for coming to see us today.   Please call our office or send a message through MyChart if you have any questions or concerns.                                                                                                                                                           Orestes Geiman RN, BSN, OCN, CBCN, CN-BN  Clinical Nurse Coordinator for Lori Ranallo, APRN-BC        [facility]   [phone] (nurse phone)  [phone] (URGENT NEED or After-Hours Line - Answered 24/7)  [phone] (fax)      [facility]   Clinic: Monday, Thursday, Friday   Admin: Tuesday  [address]  [place], [place] [address]  Scheduling: [phone]  Fax: [phone]   [facility]   Clinic: Wednesday  [address]  [place], [place] [address]  Scheduling: [phone]                         [phone]                         [phone]   Fax: [phone] or [phone]

## 2023-04-08 ENCOUNTER — Encounter: Admit: 2023-04-08 | Discharge: 2023-04-08 | Payer: MEDICARE

## 2023-04-08 DIAGNOSIS — M549 Dorsalgia, unspecified: Secondary | ICD-10-CM

## 2023-04-08 DIAGNOSIS — Z1231 Encounter for screening mammogram for malignant neoplasm of breast: Secondary | ICD-10-CM

## 2023-04-08 DIAGNOSIS — M1990 Unspecified osteoarthritis, unspecified site: Secondary | ICD-10-CM

## 2023-04-08 DIAGNOSIS — M1810 Unilateral primary osteoarthritis of first carpometacarpal joint, unspecified hand: Secondary | ICD-10-CM

## 2023-04-08 DIAGNOSIS — Z9189 Other specified personal risk factors, not elsewhere classified: Secondary | ICD-10-CM

## 2023-04-08 DIAGNOSIS — N6091 Unspecified benign mammary dysplasia of right breast: Secondary | ICD-10-CM

## 2023-04-08 DIAGNOSIS — Z803 Family history of malignant neoplasm of breast: Secondary | ICD-10-CM

## 2023-04-08 DIAGNOSIS — Z1239 Encounter for other screening for malignant neoplasm of breast: Secondary | ICD-10-CM

## 2023-04-08 DIAGNOSIS — Z1379 Encounter for other screening for genetic and chromosomal anomalies: Secondary | ICD-10-CM

## 2023-04-08 DIAGNOSIS — E039 Hypothyroidism, unspecified: Secondary | ICD-10-CM

## 2023-10-16 ENCOUNTER — Encounter: Admit: 2023-10-16 | Discharge: 2023-10-16 | Payer: MEDICARE

## 2023-10-30 ENCOUNTER — Encounter: Admit: 2023-10-30 | Discharge: 2023-10-30 | Payer: MEDICARE

## 2023-10-31 ENCOUNTER — Encounter: Admit: 2023-10-31 | Discharge: 2023-10-31 | Payer: MEDICARE

## 2023-10-31 NOTE — Progress Notes
Name: Miranda Cuevas [facility]          MRN: [id number]      DOB: [birth year]      AGE: 68 y.o.   DATE OF SERVICE: 11/01/2023    Subjective:             Reason for Visit:  Breast Cancer (HR) and Follow Up    Miranda Cuevas is a 68 y.o. female.     DIAGNOSIS:  Right Breast Atypical Ductal Hyperplasia (2015), Family HX of Breast Cancer       PHYSICIAN INFORMATION:    Referring Provider:  Melburn Popper, [facility]   OB/GYN:  Dr Jolaine Click, [facility], Phone: [phone]  PCP:  Dr Wyvonne [facility], Phone: [phone]     REPRODUCTIVE HISTORY:   Age at frist menstrual cycle: 50  Age of first live birth: 66  Number of live births: 3  Number of pregnancies: 3  Breastfeeding: yes; total of 33 months.  Age at menopause: (uterus and ovaries intact); age 32   Oral Contraceptive Use:  No  Hormone Replacement Therapy (HRT) Use:  Patient describes she does not know the name of the HRT she took age 44 to 54.     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        Family History   Problem Relation Name Age of Onset    Hypertension Mother Garwin Brothers Cholesterol Mother [place] Crane Mother Phyllis         Bilateral knee/hip replacements    Osteoporosis Mother Jamesetta So     Atrial Fibrillation Mother Jana Hakim Mother Jamesetta So     Hypertension Father [place]     High Cholesterol Father [place]     Cancer-Skin Father Pana Sister [name] 64    Arthritis-rheumatoid Sister [name]     Crohn's Disease Sister [name]     Cancer-Hematologic Sister [name] 24        Lymphoma treated with radiation    None Reported Daughter [name]     None Reported Daughter [name]         Married 08/2017    None Reported Daughter [name]         Married     Asthma Sister Marjean Donna     None Reported Grandchild Will     Cancer-Skin Paternal Uncle Richard     Cancer-Lung Maternal Aunt [name]     Cancer-Breast Other Lucille         MGA    None Reported Sheran Fava         due date 06/23/20    None Reported Grandchild [name]) Norwood Levo         due date 06/23/20     GENETIC TESTING:   09/17/2019 [facility] Multi-Cancer Panel Test: no mutation. VUS; BARD1 c.542A>G (p.Glu181Gly)  04/08/2023 Verification of status of VUS through [employer]. Remains variant of uncertain significance.    HISTORY OF PRESENT ILLNESS:   Patient returns to the Breast Cancer Prevention Clinic today for continued surveillance. She is at risk of the development of breast cancer due to a personal history of atypical ductal hyperplasia and FH (sister) of breast cancer. She was referred to my clinic by [name], PA-C, [facility].  Review of Systems  No unusual fatigue or distress at this visit.   Retired 05/02/2018; [name] and moved to [place] (new model house - reverse flip).   Husband retired 11/2017. Married; [name] x 40 years (30-Jul-2020)  Father passed away 2022/08/19 (age 35). Mother age 38 (died 06-06-2023).   COVID vaccination completed (12/22/2019 and 01/12/2020; Pfizer).   COVID booster 02/02/2020; [employer].   COVID booster 08/12/2020; Pfizer  COVID bivalent booster 08/06/2021; Moderna.  COVID booster 09/10/2022; Moderna.   Flu shot (08/06/2021).   Dr. [name] (sinus surgery) on 01/2019 due to recurring sinus infections.  Weight (BMI 36.75; # 215 <- 35.7; # 209 <- 34.47; # 202 <- 34.67; # 203 <- 34.47; # 202 <- 33.38; # 196 <- 32.89; # 193 <- 32.25; # 206 <- 31.32; # 200 <- 30.7; # 196)   09/17/2019 [facility] Multi-Cancer Panel Test: no mutation.   VUS; BARD1 c.542A>G (p.Glu181Gly)  Sister [name]) diagnosed with HER-2 + breast cancer (03/2017)l. Doing well ()  Daughter [name] (Laurice Record 12/05/2018 and then [name] and Bard Herbert 06/2020). They live in [place].   Daughter [name] (04/2019; Anneliese; and 05/2021 [facility]).   Daughter [name] has (Will) lives in the [facility] area  No fever or chills.   No headache or dizziness.   No SOB or cough.   GERD; now on pantoprazole (Protonix).    No chest pain or palpations.  Diagnosed with asthma (2021); not using inhaler frequently.   No current therapy for HTN.   Currently on medication for hyperlipidemia.  No family history of cardiac disease.   Mother (A-Fib; age 81), still living.   No breast changes.   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    History of (arthritic bone removal; 10/03/2017 on the left). Right surgery 01/02/2018 [facility]; plastic surgery).   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 Left foot (wore a boot; PT); 04/08/2023. Doing well.   Left rotator cuff tear (causing a lot of pain; currently in PT; 11/01/2023).   No nausea, vomiting. Issues with bowel function since 03/2017 (alternating diarrhea and constipation)  No dysuria or hematochezia.   Colonoscopy (09/01/2019); normal (diverticulitis); [facility].  No FH of colon cancer  LMP: uterus and ovaries intact (age 22); HRT x 10 years (stopped 2015 with diagnosis of atypia).   No vaginal dryness; not currently sexually active.   No rash, ulcerations of the skin or changing moles.   Established with a dermatologist [name]).   (2) pre-cancer facial lesions treated (06/2021).   FH of ocular melanoma (mother).   Patient  reports that she has never smoked. She has never used smokeless tobacco.  Patient  reports current alcohol use of about 3.0 - 5.0 standard drinks of alcohol per week.  Current exercise: none currently.   Bone density test (~ 5 years ago at [facility]); thinks the test was normal.   Long history of depression; takes Prozac. (refills through Dr. Chilton Si; endocrinologist).       Objective:          albuterol 0.083% (PROVENTIL) 2.5 mg /3 mL (0.083 %) nebulizer solution USE 3 ML VIA NEBULIZER EVERY 4 TO 6 HOURS AS NEEDED (Patient not taking: Reported on 11/01/2023)    CALCIUM CARBONATE/VITAMIN D2 (CALCIUM + VITAMIN D PO) Take 2 tablets by mouth daily. 600 mg    Cholecalciferol (Vitamin D3) 50 mcg (2,000 unit) capsule Take one capsule by mouth daily.  cholecalciferol (vitD3)/vit K2 (VITAMIN D3-VITAMIN K2 PO) Take 2 drops by mouth daily.    DOCOSAHEXANOIC ACID/EPA (FISH OIL PO) Take 520 mg by mouth daily.    FLUoxetine (PROZAC) 20 mg capsule Take one capsule by mouth daily.    fluticasone-salmeterol (WIXELA INHUB) 250-50 mcg/dose inhalation disk INHALE 1 PUFF BY MOUTH TWICE DAILY    levocetirizine dihydrochloride (XYZAL PO) Take  by mouth daily.    NAPROXEN SODIUM (ALEVE PO) Take  by mouth daily.    olopatadine (PATADAY ONCE DAILY RELIEF) 0.7 % ophthalmic drop     pantoprazole DR (PROTONIX) 40 mg tablet     pravastatin (PRAVACHOL) 20 mg tablet Take one tablet by mouth at bedtime daily.    SYNTHROID 112 mcg tablet Take one tablet by mouth daily.    TRIAMCINOLONE ACETONIDE (NASACORT NA) Apply  into nose as directed daily.     Vitals:    11/01/23 1022   BP: (!) 148/84   BP Source: Arm, Right Upper   Pulse: 66   Temp: 36.5 ?C (97.7 ?F)   Resp: 16   SpO2: 97%   TempSrc: Oral   PainSc: Zero   Weight: 97.9 kg (215 lb 12.8 oz)     Body mass index is 36.75 kg/m?Marland Kitchen        Pain Addressed:  N/A    Patient Evaluated for a Clinical Trial: No treatment clinical trial available for this patient.     [place] Cooperative Oncology Group performance status is Not applicable. Marland Kitchen     Physical Exam  Vitals reviewed.   Chest:       Lymphadenopathy:      Cervical: No cervical adenopathy.      Upper Body:      Right upper body: No supraclavicular adenopathy.      Left upper body: No supraclavicular adenopathy.     This is a 68 year old female in no acute distress, well developed.   09/17/2019 Invitae Multi-Cancer Panel Test: no mutation.   VUS; BARD1 c.542A>G (p.Glu181Gly)  HEENT: No icterus.   Neck: No JVD, supple.   Chest: CTA bilaterally.   CV: RRR without murmur.  Abdomen: Soft, non-distended, non-tender, positive bowel sounds, no organomegaly.   Skin: No rash or suspicious appearing moles  Extremities: No clubbing, cyanosis or edema.   Back: No pain with palpation or percussion.   Neuro: CN II-XII grossly intact; no sensory or motor abnormalities noted.          Breast Health History & Imaging:  Date Test Results   01/02/13 Bilateral Screening Mammogram BI-RADS 1   04/06/14 Bilateral Screening Mammogram BI-RADS 0   04/06/14 Right DX Mammogram BI-RADS 4   04/08/14 Right Breast Needle BX Final Diagnosis:   A. Breast, right 10 o'clock with calcs breast, needle biopsy: Very focal atypical ductal hyperplasia with intraluminal microcalcifications and columnar cell changes. See comment.   B. Breast, right 10 o'clock no calcs breast, needle biopsy: Benign breast tissue. There is no evidence of malignancy.   Comment: Radiologic correlation for residual lesion is indicated.   06/10/14  Right Breast Lumpectomy Final Diagnosis:  A. Breast, right breast lumpectomy:     Fibrocystic changes with usual ductal hyperplasia, cysts, apocrine metaplasia, and columnar cell changes. Changes consistent with previous biopsy site. There is no evidence of malignancy.        12/17/14 Bilateral DX Mammogram BI-RADS 2   12/22/15 Bilateral Screening Mammogram BI-RADS 1   01/09/17 Bilateral DX Mammogram BI-RADS 2   09/03/17  Right DX Mammogram  Right Breast US BI-RADS 1 (Overall)     01/14/2018 BILATERAL MAMMOGRAM (TOMO):  ACR BI-RADS? Assessments: BIRAD 2-Benign  07/18/2018 BREAST MRI: ACR BI-RADS? Assessments: BIRAD 1-Negative  03/26/2019 BILATERAL MAMMOGRAM (TOMO):  ASSESSMENT: BIRAD 2-Benign   09/17/2019 BREAST MRI: ASSESSMENT: BIRAD 1-Negative   03/31/2020 BILATERAL MAMMOGRAM (TOMO):  ASSESSMENT: BIRAD 2-Benign  09/19/2020 BREAST MRI:  ASSESSMENT: BIRAD 2-Benign   04/07/2021 BILATERAL MAMMOGRAM (TOMO): ASSESSMENT: BIRAD 1-Negative   09/07/2021 BREAST MRI: ASSESSMENT: BIRAD 1-Negative   04/05/2022 BILATERAL MAMMOGRAM (TOMO):   ASSESSMENT: BIRAD 0-Incomplete: need additional imaging evaluation   RECOMMENDATION: Follow-up diagnostic mammogram and ultrasound of the right breast.   04/05/2022 RIGHT DIAGNOSTIC MAMMOGRAM: ASSESSMENT: BIRAD 1-Negative   10/25/2022 BREAST MRI: ASSESSMENT: Overall: 1 - Negative   04/08/2023 BILATERAL MAMMOGRAM (TOMO): ASSESSMENT: Overall: 1 - Negative   11/01/2023 BREAST MRI: ASSESSMENT: Overall: 1 - Negative     BONE IMAGING:   12/23/2017 BMD:  Normal   04/2019 BMD: [facility] (normal)         Breast Cancer Risk Assessment    Acute issues addressed today: Elevated risk for breast cancer development      Risk Assessment    Miranda Cuevas - this includes assessment based on your age, reproductive history and first degree relatives with breast cancer and your personal history of breast biopsies.    Your 5 year risk of developing breast cancer (calculated at 68 y.o.): 8.4 %  Average women?s risk:  1.8 %    Your lifetime risk of developing breast cancer (to age 69): 34.2 %   Average women?s risk:  8.8 %    Tyrer-Cuzick Model - this model includes a more extensive assessment of risk factors, including age, height, weight, reproductive history, benign breast lesions, history of hormone replacement therapy use and a more extensive evaluation of your family history.     Your 10 year risk of developing breast cancer (calculated at 68 y.o.): 15.4 %  Average women?s risk: 3.6 %    Your lifetime risk of developing breast cancer: 33.3 %  Average women?s risk: 8.7 %         Breast Cancer Screening Recommendations:    In addition to a clinical breast exam every 6 months, our screening imaging recommendations are:  Mammogram: 12 months   Include tomosynthesis (3D) if available?  yes    Breast MRI: 12 months Screening: Based on her lifetime risk of breast cancer estimated at over 20%, we recommend the following based on NCCN screening guidelines: Clinical exam with clinical breast exam every 6-12 months and annual screening mammogram with tomosynthesis. We also recommend annual breast MRI. We discussed the risks and benefits of breast MRI including increased sensitivity but also increase risk for finding benign disease, increased potential for biopsy and financial out of pocket burden. TCounseled on breast awareness including concerning changes of palpating a breast or axillary mass, nipple inversion, nipple discharge or skin changes which should prompt evaluation.           Prevention recommendations:    Maintain a healthy body weight.    Exercise regularly.  Alcohol in moderation (less than 1 drink per day).         1. History of ADH (2015) and FH of breast cancer (sister and MGAunt).   2. Genetic testing. 09/17/2019 [facility]ae Multi-Cancer Panel Test: no mutation. VUS; BARD1 c.542A>G (p.Glu181Gly).11/01/2023 Verification of status of VUS through [employer]. Remains variant of uncertain significance.  3. Short term risk: Patient did  try raloxifene for approximately 6 weeks; discontinued due to severe side effects of hot flashes.   4. Long term risk: Alternating breast imaging every 6 months (mammogram (TOMO) MRI due to lifetime risk > 20%) with clinical breast examination.   5. RTC 04/09/2024 with bilateral mammogram and then 10/2024 with MRI.     Total Time Today was 40 minutes in the following activities: Preparing to see the patient, Obtaining and/or reviewing separately obtained history, Performing a medically appropriate examination and/or evaluation, Counseling and educating the patient/family/caregiver, Ordering medications, tests, or procedures, Referring and communication with other health care professionals (when not separately reported), Documenting clinical information in the electronic or other health record, Independently interpreting results (not separately reported) and communicating results to the patient/family/caregiver, and Care coordination (not separately reported)      Ernie Hew, APRN-BC, CBCN  Nurse Practitioner  Collaborating physician: Dellia Cloud, MD  NPI # [id number]      Sharia Reeve, APRN-NP

## 2023-11-01 ENCOUNTER — Encounter: Admit: 2023-11-01 | Discharge: 2023-11-01 | Payer: MEDICARE

## 2023-11-01 DIAGNOSIS — N6091 Unspecified benign mammary dysplasia of right breast: Secondary | ICD-10-CM

## 2023-11-01 DIAGNOSIS — Z9189 Other specified personal risk factors, not elsewhere classified: Secondary | ICD-10-CM

## 2023-11-01 DIAGNOSIS — Z79899 Other long term (current) drug therapy: Secondary | ICD-10-CM

## 2023-11-01 DIAGNOSIS — Z803 Family history of malignant neoplasm of breast: Secondary | ICD-10-CM

## 2023-11-01 MED ADMIN — SODIUM CHLORIDE 0.9 % IJ SOLN [7319]: 50 mL | INTRAVENOUS | @ 16:00:00 | Stop: 2023-11-01 | NDC 00409488850

## 2023-11-01 MED ADMIN — GADOBENATE DIMEGLUMINE 529 MG/ML (0.1MMOL/0.2ML) IV SOLN [135881]: 19 mL | INTRAVENOUS | @ 16:00:00 | Stop: 2023-11-01 | NDC 00270516415

## 2023-11-01 NOTE — Patient Instructions
 Keenya,     Thank you for coming to see [place] today.   Please call our office or send a message through MyChart if you have any questions or concerns.  When calling please leave your name (including spelling), date of birth, phone number where you can be  reached and a description of why you are calling. We will return your call as soon as possible.   Sometimes the clinic can get very busy and we might not be able to get to your phone call until later in the day.    Lab/Imaging Results: Due to the CARES act, as of April 1st, 2021; results automatically release to MyChart.   Ernie Hew, APRN will continue to send you a result note on anything that she orders.   With these changes you may see your results before she does.   Critical lab results will be addressed immediately.   Please allow up to 72 hours for Ernie Hew, APRN to review and respond to your results before reaching out with any questions.                                                                                                                                                             Devoria Glassing RN, BSN, OCN, CBCN, CN-BN  Clinical Nurse Coordinator for Ernie Hew, APRN-BC        [facility] Cancer [facility] Surviv[facility]   [phone] (nurse phone)  [phone] (URGENT NEED or After-Hours Line - Answered 24/7)  [phone] (fax)      [facility]   Clinic: Monday, Thursday, Friday   Admin: Tuesday  [address] [facility]. [address]  [place], [place] [address]  Scheduling: [phone]  Fax: [phone]   [facility]: Wednesday  [address]  [place], [place] [address]  Scheduling: [phone]                         [phone]   Fax: [phone] or [phone]

## 2023-11-07 ENCOUNTER — Encounter: Admit: 2023-11-07 | Discharge: 2023-11-07 | Payer: MEDICARE

## 2023-11-20 ENCOUNTER — Encounter: Admit: 2023-11-20 | Discharge: 2023-11-20 | Payer: MEDICARE

## 2024-04-09 ENCOUNTER — Encounter: Admit: 2024-04-09 | Discharge: 2024-04-09 | Payer: MEDICARE

## 2024-05-08 ENCOUNTER — Encounter: Admit: 2024-05-08 | Discharge: 2024-05-08 | Payer: MEDICARE

## 2024-05-11 ENCOUNTER — Encounter: Admit: 2024-05-11 | Discharge: 2024-05-11 | Payer: MEDICARE

## 2024-05-12 NOTE — Progress Notes
 Name: Miranda Cuevas          MRN: [id number]      DOB: 1956-06-05      AGE: 68 y.o.   DATE OF SERVICE: 05/15/2024    Subjective:             Reason for Visit:  Breast Cancer (HR) and Follow Up    Miranda Cuevas is a 68 y.o. female.     DIAGNOSIS:  Right Breast Atypical Ductal Hyperplasia (2015), Family HX of Breast Cancer       PHYSICIAN INFORMATION:    Referring Provider:  Lauraine Margaret Cuevas, [facility]   OB/GYN:  Miranda Cuevas, [facility], Phone: [phone]  PCP:  [facility], Phone: [phone]     REPRODUCTIVE HISTORY:   Age at frist menstrual cycle: 89  Age of first live birth: 61  Number of live births: 3  Number of pregnancies: 3  Breastfeeding: yes; total of 33 months.  Age at menopause: (uterus and ovaries intact); age 19   Oral Contraceptive Use:  No  Hormone Replacement Therapy (HRT) Use:  Patient describes she does not know the name of the HRT she took age 69 to 78.     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        Family History   Problem Relation Name Age of Onset    Hypertension Mother Miranda Cuevas     High Cholesterol Mother Miranda Cuevas Miranda Cuevas Mother Miranda Cuevas         Bilateral knee/hip replacements    Osteoporosis Mother Miranda Cuevas     Atrial Fibrillation Mother Miranda Cuevas Cuevas Mother Miranda Cuevas     Depression Mother Miranda Cuevas     Hypertension Father Miranda Cuevas     High Cholesterol Father Miranda Cuevas     Cancer-Skin Father Miranda Cuevas Sister Miranda Cuevas 64    Arthritis-rheumatoid Sister Miranda Cuevas     Crohn's Disease Sister Miranda Cuevas     Cancer-Hematologic Sister Miranda Cuevas 24        Lymphoma treated with radiation    Asthma Sister Miranda Cuevas     None Reported Daughter Miranda Cuevas     None Reported Daughter Miranda Cuevas         Married 08/2017    None Reported Daughter Miranda Cuevas         Married     Cancer-Lung Maternal Aunt Miranda Cuevas Paternal Uncle Miranda Cuevas     None Reported Grandchild Miranda Cuevas     None Reported Grandchild Miranda Cuevas     None Reported Grandchild Miranda Cuevas     None Reported Kee Muskrat         due date 06/23/20    None Reported Grandchild Reda) Ricardo Mt         due date 06/23/20    Cancer-Breast Other Miranda Cuevas         Miranda Cuevas     GENETIC TESTING:   09/17/2019 [facility] Multi-Cancer Panel Test: no mutation. VUS; BARD1 c.542A>G (p.Glu181Gly)  05/15/2024 Verification of status of VUS through [employer]. Remains variant of uncertain significance.    HISTORY OF PRESENT ILLNESS:   Patient returns to the Breast Cancer Prevention Clinic today for continued surveillance. She is at risk of the development of breast cancer due to a personal history of atypical ductal hyperplasia and FH (  sister) of breast cancer. She was referred to my clinic by Miranda Cuevas [place], PA-C, [facility].          Review of Systems  No unusual fatigue or distress at this visit.   Retired 05/02/2018; [employer] and moved to [place] (new model house - reverse flip). Husband retired 11/2017. Married; Miranda Cuevas x 40 years (08/03/2020)  Father passed away 08/07/22 (age 26). Mother age 59 (died 05-25-2023).   Traveled with Miranda Cuevas; to [place] (03/2024).   Loved the trip but fell on the last day and suffered rib contusions, right breast trauma/bruising. Currently in PT and improving - x-rays were negative for fracture.   COVID vaccination completed (12/22/2019 and 01/12/2020; Pfizer).   COVID booster 02/02/2020; Miranda Cuevas.   COVID booster 08/12/2020; Pfizer  COVID bivalent booster 08/06/2021; Moderna.  COVID booster 09/10/2022; Moderna.   Flu shot (08/06/2021).   Miranda. Chuck (sinus surgery) on 01/2019 due to recurring sinus infections.  Weight (BMI 36.07; # 211 <- 36.75; # 215 <- 35.7; # 209 <- 34.47; # 202 <- 34.67; # 203 <- 34.47; # 202 <- 33.38; # 196 <- 32.89; # 193 <- 32.25; # 206 <- 31.32; # 200 <- 30.7; # 196)   09/17/2019 [facility] Multi-Cancer Panel Test: no mutation.   VUS; BARD1 c.542A>G (p.Glu181Gly)  Sister Miranda Cuevas) diagnosed with HER-2 + breast cancer (03/2017)l. Doing well ()  Daughter Miranda Cuevas (Gregg 12/05/2018 and then Tess and Garrison 06/2020). They live in [place] . Miranda Cuevas is pregnant again (twin; boy and girl)  Daughter Miranda Cuevas (04/2019; Miranda Cuevas; and 03-Jun-2021 [facility]).   Daughter Miranda Cuevas has (Miranda Cuevas) lives in the [facility] area  No fever or chills.   No headache or dizziness.   No SOB or cough.   GERD; now on pantoprazole (Protonix).    No chest pain or palpations.  Diagnosed with asthma Jun 03, 2020); not using inhaler frequently.   No current therapy for HTN.   Currently on medication for hyperlipidemia.  No family history of cardiac disease.   Mother (A-Fib; age 53),died June 04, 2023  No breast changes.   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    History of (arthritic bone removal; 10/03/2017 on the left). Right surgery 01/02/2018 [facility]; plastic surgery).   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 Left foot (wore a boot; PT); 04/08/2023. Doing well.   Left rotator cuff tear (causing a lot of pain; currently in PT; 11/01/2023).   No nausea, vomiting. Issues with bowel function since 03/2017 (alternating diarrhea and constipation)  No dysuria or hematochezia.   Colonoscopy (09/01/2019); normal (diverticulitis); [facility]  No FH of colon cancer  LMP: uterus and ovaries intact (age 89); HRT x 10 years (stopped June 03, 2014 with diagnosis of atypia).   No vaginal dryness; not currently sexually active.   No rash, ulcerations of the skin or changing moles.   Established with a dermatologist Miranda Cuevas).   (2) pre-cancer facial lesions treated (06/2021).   FH of ocular melanoma (mother).   Patient  reports that she has never smoked. She has never used smokeless tobacco.  Patient  reports current alcohol use of about 3.0 - 5.0 standard drinks of alcohol per week.  Current exercise: none currently.   Bone density test (~ 5 years ago at [facility]); thinks the test was normal.   Long history of depression; takes Prozac. (refills through Miranda. Landy; endocrinologist).       Objective:  albuterol 0.083% (PROVENTIL) 2.5 mg /3 mL (0.083 %) nebulizer solution USE 3 ML VIA NEBULIZER EVERY 4 TO 6 HOURS AS NEEDED (Patient not taking: Reported on 05/15/2024)    CALCIUM CARBONATE/VITAMIN D2 (CALCIUM + VITAMIN D PO) Take 2 tablets by mouth daily. 600 mg    carboxymethylcellulose sodium (REFRESH TEARS) 0.5 % eye drop Apply  to both eyes daily.    Cholecalciferol (Vitamin D3) 50 mcg (2,000 unit) capsule Take one capsule by mouth daily.    cholecalciferol (vitD3)/vit K2 (VITAMIN D3-VITAMIN K2 PO) Take 2 drops by mouth daily.    DOCOSAHEXANOIC ACID/EPA (FISH OIL PO) Take 520 mg by mouth daily.    FLUoxetine (PROZAC) 20 mg capsule Take one capsule by mouth daily.    fluticasone-salmeterol (WIXELA INHUB) 250-50 mcg/dose inhalation disk INHALE 1 PUFF BY MOUTH TWICE DAILY    levocetirizine dihydrochloride (XYZAL PO) Take  by mouth daily.    meloxicam (MOBIC) 15 mg tablet     NAPROXEN SODIUM (ALEVE PO) Take  by mouth daily.    olopatadine (PATADAY ONCE DAILY RELIEF) 0.7 % ophthalmic drop     pantoprazole Miranda (PROTONIX) 40 mg tablet     pravastatin (PRAVACHOL) 20 mg tablet Take one tablet by mouth at bedtime daily.    SYNTHROID 112 mcg tablet Take one tablet by mouth daily.    TRIAMCINOLONE ACETONIDE (NASACORT NA) Apply  into nose as directed daily.     Vitals:    05/15/24 0822   BP: (!) 166/75   BP Source: Arm, Right Upper   Pulse: 77   Temp: 36.6 ?C (97.8 ?F)   Resp: 16   SpO2: 100%   TempSrc: Oral   PainSc: Zero   Weight: 96.1 kg (211 lb 12.8 oz)       Body mass index is 36.07 kg/m?SABRA        Pain Addressed:  N/A    Patient Evaluated for a Clinical Trial: No treatment clinical trial available for this patient.     [place] Cooperative Oncology Group performance status is Not applicable. SABRA     Physical Exam  Vitals reviewed.   Chest:       Lymphadenopathy:      Cervical: No cervical adenopathy.      Upper Body:      Right upper body: No supraclavicular or axillary adenopathy.      Left upper body: No supraclavicular or axillary adenopathy.     This is a 68 year old female in no acute distress, well developed.   09/17/2019 Inv[facility]ae Multi-Cancer Panel Test: no mutation.   VUS; BARD1 c.542A>G (p.Glu181Gly)  HEENT: No icterus.   Neck: No JVD, supple.   Chest: CTA bilaterally.   CV: RRR without murmur.  Abdomen: Soft, non-distended, non-tender, positive bowel sounds, no organomegaly.   Skin: No rash or suspicious appearing moles  Extremities: No clubbing, cyanosis or edema.   Back: No pain with palpation or percussion.   Neuro: CN II-XII grossly intact; no sensory or motor abnormalities noted.          Breast Health History & Imaging:  Date Test Results   01/02/13 Bilateral Screening Mammogram BI-RADS 1   04/06/14 Bilateral Screening Mammogram BI-RADS 0   04/06/14 Right DX Mammogram BI-RADS 4   04/08/14 Right Breast Needle BX Final Diagnosis:   A. Breast, right 10 o'clock with calcs breast, needle biopsy: Very focal atypical ductal hyperplasia with intraluminal microcalcifications and columnar cell changes. See comment.   B. Breast, right 10 o'clock  no calcs breast, needle biopsy: Benign breast tissue. There is no evidence of malignancy.   Comment: Radiologic correlation for residual lesion is indicated.   06/10/14  Right Breast Lumpectomy Final Diagnosis:  A. Breast, right breast lumpectomy:     Fibrocystic changes with usual ductal hyperplasia, cysts, apocrine metaplasia, and columnar cell changes. Changes consistent with previous biopsy site. There is no evidence of malignancy.        12/17/14 Bilateral DX Mammogram BI-RADS 2   12/22/15 Bilateral Screening Mammogram BI-RADS 1   01/09/17 Bilateral DX Mammogram BI-RADS 2   09/03/17 Right DX Mammogram  Right Breast US  BI-RADS 1 (Overall)     01/14/2018 BILATERAL MAMMOGRAM (TOMO): ACR BI-RADS? Assessments: BIRAD 2-Benign  07/18/2018 BREAST MRI: ACR BI-RADS? Assessments: BIRAD 1-Negative  03/26/2019 BILATERAL MAMMOGRAM (TOMO):  ASSESSMENT: BIRAD 2-Benign   09/17/2019 BREAST MRI: ASSESSMENT: BIRAD 1-Negative   03/31/2020 BILATERAL MAMMOGRAM (TOMO):  ASSESSMENT: BIRAD 2-Benign  09/19/2020 BREAST MRI:  ASSESSMENT: BIRAD 2-Benign   04/07/2021 BILATERAL MAMMOGRAM (TOMO): ASSESSMENT: BIRAD 1-Negative   09/07/2021 BREAST MRI: ASSESSMENT: BIRAD 1-Negative   04/05/2022 BILATERAL MAMMOGRAM (TOMO):   ASSESSMENT: BIRAD 0-Incomplete: need additional imaging evaluation   RECOMMENDATION: Follow-up diagnostic mammogram and ultrasound of the right breast.   04/05/2022 RIGHT DIAGNOSTIC MAMMOGRAM: ASSESSMENT: BIRAD 1-Negative   10/25/2022 BREAST MRI: ASSESSMENT: Overall: 1 - Negative   04/08/2023 BILATERAL MAMMOGRAM (TOMO): ASSESSMENT: Overall: 1 - Negative   11/01/2023 BREAST MRI: ASSESSMENT: Overall: 1 - Negative   05/15/2024 BILATERAL MAMMOGRAM (TOMO): ASSESSMENT:   Left: 1 - Negative   Right: 2 - Benign   Overall: 2 - Benign           BONE IMAGING:   12/23/2017 BMD:  Normal   04/2019 BMD: [facility] (normal)         Breast Cancer Risk Assessment    Acute issues addressed today: Elevated risk for breast cancer development      Risk Assessment    Miranda Cuevas - this includes assessment based on your age, reproductive history and first degree relatives with breast cancer and your personal history of breast biopsies.    Your 5 year risk of developing breast cancer (calculated at 68 y.o.): 8.4 %  Average women?s risk:  1.8 %    Your lifetime risk of developing breast cancer (to age 41): 34.2 %   Average women?s risk:  8.8 %    Tyrer-Cuzick Model - this model includes a more extensive assessment of risk factors, including age, height, weight, reproductive history, benign breast lesions, history of hormone replacement therapy use and a more extensive evaluation of your family history.     Your 10 year risk of developing breast cancer (calculated at 68 y.o.): 15.4 %  Average women?s risk: 3.6 %    Your lifetime risk of developing breast cancer: 33.3 %  Average women?s risk: 8.7 %         Breast Cancer Screening Recommendations:    In addition to a clinical breast exam every 6 months, our screening imaging recommendations are:  Mammogram: 12 months   Include tomosynthesis (3D) if available?  yes    Breast MRI: 12 months Screening: Based on her lifetime risk of breast cancer estimated at over 20%, we recommend the following based on NCCN screening guidelines: Clinical exam with clinical breast exam every 6-12 months and annual screening mammogram with tomosynthesis. We also recommend annual breast MRI. We discussed the risks and benefits of breast MRI including increased sensitivity but also increase risk for  finding benign disease, increased potential for biopsy and financial out of pocket burden. Counseled on breast awareness including concerning changes of palpating a breast or axillary mass, nipple inversion, nipple discharge or skin changes which should prompt evaluation.           Prevention recommendations:    Maintain a healthy body weight.    Exercise regularly.  Alcohol in moderation (less than 1 drink per day).         1. History of ADH (2015) and FH of breast cancer (sister and MGAunt).   2. Genetic testing. 09/17/2019 [facility]ae Multi-Cancer Panel Test: no mutation. VUS; BARD1 c.542A>G (p.Glu181Gly).11/01/2023 Verification of status of VUS through [employer]. Remains variant of uncertain significance.  3. Short term risk: Patient did try raloxifene  for approximately 6 weeks; discontinued due to severe side effects of hot flashes.   4. Long term risk: Alternating breast imaging every 6 months (mammogram (TOMO) MRI due to lifetime risk > 20%) with clinical breast examination.   5. RTC 10/22/2024 with MRI and then 05/2025 with bilateral mammogram.     Total Time Today was 40 minutes in the following activities: Preparing to see the patient, Obtaining and/or reviewing separately obtained history, Performing a medically appropriate examination and/or evaluation, Counseling and educating the patient/family/caregiver, Ordering medications, tests, or procedures, Referring and communication with other health care professionals (when not separately reported), Documenting clinical information in the electronic or other health record, Independently interpreting results (not separately reported) and communicating results to the patient/family/caregiver, and Care coordination (not separately reported)      Katheryn Pollard, APRN-BC, CBCN  Nurse Practitioner  Collaborating physician: Arlean Blumenthal, MD  NPI # [id number]      Katheryn KATHEE Pollard, APRN-NP

## 2024-05-14 NOTE — Patient Instructions
 Sola,     Thank you for coming to see us  today.   Please call our office or send a message through MyChart if you have any questions or concerns.  When calling please leave your name (including spelling), date of birth, phone number where you can be  reached and a description of why you are calling. We will return your call as soon as possible.   Sometimes the clinic can get very busy and we might not be able to get to your phone call until later in the day.    Lab/Imaging Results: Due to the CARES act, as of April 1st, 2021; results automatically release to MyChart.   Katheryn Pollard, APRN will continue to send you a result note on anything that she orders.   With these changes you may see your results before she does.   Critical lab results will be addressed immediately.   Please allow up to 72 hours for Katheryn Pollard, APRN to review and respond to your results before reaching out with any questions.                                                                                                                                                             Ronal Pouch RN, BSN, OCN, CBCN, CN-BN  Clinical Nurse Coordinator for Katheryn Pollard, APRN-BC  [email or web address]         Breast Cancer Prevention and Survivorship Clinic   [phone] (nurse phone)  [phone] (URGENT NEED or After-Hours Line - Answered 24/7)  [phone] (fax)      [facility]   Clinic: Monday, Thursday, Friday   Admin: Tuesday  [address]  [place], [place] [address]  Scheduling: [phone]  Fax: [phone]   [facility]: Wednesday  [address]  [place], [place] [address]  Scheduling: [phone]                         [phone]   Fax: [phone] or [phone]

## 2024-05-15 ENCOUNTER — Encounter: Admit: 2024-05-15 | Discharge: 2024-05-15 | Payer: MEDICARE

## 2024-05-15 DIAGNOSIS — Z803 Family history of malignant neoplasm of breast: Secondary | ICD-10-CM

## 2024-05-15 DIAGNOSIS — Z1239 Encounter for other screening for malignant neoplasm of breast: Secondary | ICD-10-CM

## 2024-05-15 DIAGNOSIS — Z9189 Other specified personal risk factors, not elsewhere classified: Secondary | ICD-10-CM

## 2024-05-15 DIAGNOSIS — Z1231 Encounter for screening mammogram for malignant neoplasm of breast: Secondary | ICD-10-CM

## 2024-05-15 DIAGNOSIS — N6091 Unspecified benign mammary dysplasia of right breast: Principal | ICD-10-CM

## 2024-09-30 ENCOUNTER — Encounter: Admit: 2024-09-30 | Discharge: 2024-09-30 | Payer: MEDICARE

## 2024-10-20 NOTE — Progress Notes [1]
 Name: Miranda Cuevas [facility]          MRN: [id number]      DOB: Apr 24, 1956      AGE: 69 y.o.   DATE OF SERVICE: 10/22/2024    Subjective:             Reason for Visit:  Follow Up and Breast Cancer (HR)    Miranda Cuevas is a 69 y.o. female.     DIAGNOSIS:  Right Breast Atypical Ductal Hyperplasia (2015), Family HX of Breast Cancer       PHYSICIAN INFORMATION:    Referring Provider:  Lauraine Margaret C[facility],  CC IC   OB/GYN:  Dr Miranda Cuevas, [facility], Phone: [phone]  PCP:  Dr [facility], Phone: [phone]     REPRODUCTIVE HISTORY:   Age at frist menstrual cycle: 40  Age of first live birth: 54  Number of live births: 3  Number of pregnancies: 3  Breastfeeding: yes; total of 33 months.  Age at menopause: (uterus and ovaries intact); age 60   Oral Contraceptive Use:  No  Hormone Replacement Therapy (HRT) Use:  Patient describes she does not know the name of the HRT she took age 79 to 72.     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        Family History   Problem Relation Name Age of Onset    Hypertension Mother Miranda Cuevas     High Cholesterol Mother Miranda Cuevas Miranda Cuevas Mother Miranda Cuevas         Bilateral knee/hip replacements    Osteoporosis Mother Miranda Cuevas     Atrial Fibrillation Mother Miranda [facility] Mother Miranda [facility]     Depression Mother Miranda C[facility]vas     Hypertension Father [place]     High Cholesterol Father [place]     Cancer-Skin Father [place] Sister Miranda Cuevas 64    Arthritis-rheumatoid Sister Miranda Cuevas     Crohn's Disease Sister Miranda Cuevas     Cancer-Hematologic Sister Miranda Cuevas 24        Lymphoma treated with radiation    Asthma Sister Miranda Cuevas     None Reported Daughter Miranda     None Reported Daughter Vernell         Married 08/2017    None Reported Daughter Chiquita         Married     Cancer-Lung Maternal Aunt Miranda Cuevas Paternal Uncle Miranda Cuevas     None Reported Miranda Cuevas Will     None Reported Granddaughter Miranda Cuevas     None Reported Granddaughter Miranda Cuevas     None Reported Granddaughter Miranda Cuevas         due date 06/23/20    None Reported Grandson Reda) Ricardo [place]         due date 06/23/20    Cancer-Breast Other Miranda Cuevas         MGA     GENETIC TESTING:   09/17/2019 [facility] Multi-Cancer Panel Test: no mutation. VUS; BARD1 c.542A>G (p.Glu181Gly)  10/22/2024 Verification of status of VUS through [employer]. Remains variant of uncertain significance.    HISTORY OF PRESENT ILLNESS:   Patient returns to the [facility] Cancer [facility] today for continued surveillance. She is at risk of the development of breast cancer due to a personal history of atypical ductal hyperplasia and FH (  sister) of breast cancer. She was referred to my clinic by Miranda Lindau, PA-C, [place] [facility].          Review of Systems  No unusual fatigue or distress at this visit.   Retired 05/02/2018; [employer] and moved to [place] (new model house - reverse flip). Husband retired 11/2017. Married; Miranda Cuevas x 40 years (August 12, 2020)  Father passed away 08/17/22 (age 61). Mother age 38 (died 2023-06-04).   Traveled with Miranda Cuevas; to [place] (03/2024).    Dr. Chuck (sinus surgery) on 01/2019 due to recurring sinus infections.  Weight (BMI 36.48; # 214 <- 36.07; # 211 <- 36.75; # 215 <- 35.7; # 209 <- 34.47; # 202 <- 34.67; # 203 <- 34.47; # 202 <- 33.38; # 196 <- 32.89; # 193 <- 32.25; # 206 <- 31.32; # 200 <- 30.7; # 196)   09/17/2019 [facility] Multi-Cancer Panel Test: no mutation.   VUS; BARD1 c.542A>G (p.Glu181Gly)  Sister Miranda Cuevas) diagnosed with HER-2 + breast cancer (03/2017)  Daughter Vernell [facility] 12/05/2018 and then Miranda Cuevas and Miranda Cuevas 06/2020). They live in [place] . Vernell is pregnant again (twin; boy and girl)  Daughter Chiquita (04/2019; Miranda Cuevas; and June 12, 2021 [facility]).   Daughter Miranda has (Will) lives in the [facility] area  No fever or chills.   No headache or dizziness.   No SOB or cough.   GERD; now on pantoprazole (Protonix).    No chest pain or palpations.  Diagnosed with asthma (2021); not using inhaler frequently.   No current therapy for HTN.   Currently on medication for hyperlipidemia.  No family history of cardiac disease.   Mother (A-Fib; age 23),died 06-13-23  No breast changes.   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    History of (arthritic bone removal; 10/03/2017 on the left). Right surgery 01/02/2018 [facility]; plastic surgery).   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 Left foot (wore a boot; PT); 04/08/2023. Doing well.   Left rotator cuff tear (causing a lot of pain; currently in PT; 11/01/2023).   Right foot surgery again (09/07/2024) wearing a boot.   No nausea, vomiting. Issues with bowel function since 03/2017 (alternating diarrhea and constipation)  No dysuria or hematochezia.   Colonoscopy (09/01/2019); normal (diverticulitis); [facility]  No FH of colon cancer  LMP: uterus and ovaries intact (age 31); HRT x 10 years (stopped 2015 with diagnosis of atypia).   No vaginal dryness; not currently sexually active.   No rash, ulcerations of the skin or changing moles.   Established with a dermatologist Miranda Cuevas).   (2) pre-cancer facial lesions treated (06/2021).   Facial lesion (right eyebrow; precancerous); 08/2024  FH of ocular melanoma (mother).   Patient  reports that she has never smoked. She has never used smokeless tobacco.  Patient  reports current alcohol use of about 3.0 - 5.0 standard drinks of alcohol per week.  Current exercise: none currently.   Bone density test (~ 5 years ago at [facility]); thinks the test was normal.   Long history of depression; takes Prozac. (refills through Dr. Landy; endocrinologist).       Objective:          albuterol 0.083% (PROVENTIL) 2.5 mg /3 mL (0.083 %) nebulizer solution USE 3 ML VIA NEBULIZER EVERY 4 TO 6 HOURS AS NEEDED (Patient not taking: Reported on 10/22/2024)    CALCIUM CARBONATE/VITAMIN D2 (CALCIUM + VITAMIN D PO) Take 2  tablets by mouth daily. 600 mg    carboxymethylcellulose sodium (REFRESH TEARS) 0.5 % eye drop Apply  to both eyes daily. (Patient not taking: Reported on 10/22/2024)    carboxymethylcellulose-citric (PLENITY) 0.75 gram capsule Take one capsule by mouth daily. (Patient not taking: Reported on 10/22/2024)    Cholecalciferol (Vitamin D3) 50 mcg (2,000 unit) capsule Take one capsule by mouth daily.    cholecalciferol (vitD3)/vit K2 (VITAMIN D3-VITAMIN K2 PO) Take 2 drops by mouth daily.    DOCOSAHEXANOIC ACID/EPA (FISH OIL PO) Take 520 mg by mouth daily.    FLUoxetine (PROZAC) 20 mg capsule Take one capsule by mouth daily.    fluticasone-salmeterol (WIXELA INHUB) 250-50 mcg/dose inhalation disk INHALE 1 PUFF BY MOUTH TWICE DAILY    levocetirizine dihydrochloride (XYZAL PO) Take  by mouth daily.    meloxicam (MOBIC) 15 mg tablet     NAPROXEN SODIUM (ALEVE PO) Take  by mouth daily. (Patient not taking: Reported on 10/22/2024)    olopatadine (PATADAY ONCE DAILY RELIEF) 0.7 % ophthalmic drop     pantoprazole DR (PROTONIX) 40 mg tablet     pravastatin (PRAVACHOL) 20 mg tablet Take one tablet by mouth at bedtime daily.    SYNTHROID 112 mcg tablet Take one tablet by mouth daily.    TRIAMCINOLONE ACETONIDE (NASACORT NA) Apply  into nose as directed daily.     Vitals:    10/22/24 1347   BP: 139/72   BP Source: Arm, Right Upper   Pulse: 57   Temp: 36.6 ?C (97.8 ?F)   Resp: 14   SpO2: 97%   TempSrc: Oral   PainSc: Zero   Weight: 97.2 kg (214 lb 3.2 oz)     Body mass index is 36.48 kg/m?Miranda Cuevas      Pain Addressed:  N/A    Patient Evaluated for a Clinical Trial: No treatment clinical trial available for this patient.     [place]astern Cooperative Oncology Group performance status is Not applicable. Miranda Cuevas     Physical Exam  Vitals reviewed.   Chest:       Lymphadenopathy:      Cervical: No cervical adenopathy.      Upper Body:      Right upper body: No supraclavicular or axillary adenopathy.      Left upper body: No supraclavicular or axillary adenopathy.     This is a 69 year old female in no acute distress, well developed.   09/17/2019 [facility] Multi-Cancer Panel Test: no mutation.   VUS; BARD1 c.542A>G (p.Glu181Gly)  HEENT: No icterus.   Neck: No JVD, supple.   Chest: CTA bilaterally.   CV: RRR without murmur.  Abdomen: Soft, non-distended, non-tender, positive bowel sounds, no organomegaly.   Skin: No rash or suspicious appearing moles  Extremities: No clubbing, cyanosis or edema.   Back: No pain with palpation or percussion.   Neuro: CN II-XII grossly intact; no sensory or motor abnormalities noted.          Breast Health History & Imaging:  Date Test Results   01/02/13 Bilateral Screening Mammogram BI-RADS 1   04/06/14 Bilateral Screening Mammogram BI-RADS 0   04/06/14 Right DX Mammogram BI-RADS 4   04/08/14 Right Breast Needle BX Final Diagnosis:   A. Breast, right 10 o'clock with calcs breast, needle biopsy: Very focal atypical ductal hyperplasia with intraluminal microcalcifications and columnar cell changes. See comment.   B. Breast, right 10 o'clock no calcs breast, needle biopsy: Benign breast tissue. There is no evidence of malignancy.  Comment: Radiologic correlation for residual lesion is indicated.   06/10/14  Right Breast Lumpectomy Final Diagnosis:  A. Breast, right breast lumpectomy:     Fibrocystic changes with usual ductal hyperplasia, cysts, apocrine metaplasia, and columnar cell changes. Changes consistent with previous biopsy site. There is no evidence of malignancy.        12/17/14 Bilateral DX Mammogram BI-RADS 2   12/22/15 Bilateral Screening Mammogram BI-RADS 1   01/09/17 Bilateral DX Mammogram BI-RADS 2   09/03/17 Right DX Mammogram  Right Breast US  BI-RADS 1 (Overall)     01/14/2018 BILATERAL MAMMOGRAM (TOMO):  ACR BI-RADS? Assessments: BIRAD 2-Benign  07/18/2018 BREAST MRI: ACR BI-RADS? Assessments: BIRAD 1-Negative  03/26/2019 BILATERAL MAMMOGRAM (TOMO):  ASSESSMENT: BIRAD 2-Benign   09/17/2019 BREAST MRI: ASSESSMENT: BIRAD 1-Negative   03/31/2020 BILATERAL MAMMOGRAM (TOMO):  ASSESSMENT: BIRAD 2-Benign  09/19/2020 BREAST MRI:  ASSESSMENT: BIRAD 2-Benign   04/07/2021 BILATERAL MAMMOGRAM (TOMO): ASSESSMENT: BIRAD 1-Negative   09/07/2021 BREAST MRI: ASSESSMENT: BIRAD 1-Negative   04/05/2022 BILATERAL MAMMOGRAM (TOMO):   ASSESSMENT: BIRAD 0-Incomplete: need additional imaging evaluation   RECOMMENDATION: Follow-up diagnostic mammogram and ultrasound of the right breast.   04/05/2022 RIGHT DIAGNOSTIC MAMMOGRAM: ASSESSMENT: BIRAD 1-Negative   10/25/2022 BREAST MRI: ASSESSMENT: Overall: 1 - Negative   04/08/2023 BILATERAL MAMMOGRAM (TOMO): ASSESSMENT: Overall: 1 - Negative   11/01/2023 BREAST MRI: ASSESSMENT: Overall: 1 - Negative   05/15/2024 BILATERAL MAMMOGRAM (TOMO): ASSESSMENT:   Left: 1 - Negative   Right: 2 - Benign   Overall: 2 - Benign   10/22/2024 BREAST MRI: ASSESSMENT:   Overall: 1 - Negative          BONE IMAGING:   12/23/2017 BMD:  Normal   04/2019 BMD: [facility] (normal)         Breast Cancer Risk Assessment    Acute issues addressed today: Elevated risk for breast cancer development      Risk Assessment    Miranda Cuevas - this includes assessment based on your age, reproductive history and first degree relatives with breast cancer and your personal history of breast biopsies.    Your 5 year risk of developing breast cancer (calculated at 69 y.o.): 8.4 %  Average women?s risk:  1.8 %    Your lifetime risk of developing breast cancer (to age 4): 34.2 %   Average women?s risk:  8.8 %    Tyrer-Cuzick Model - this model includes a more extensive assessment of risk factors, including age, height, weight, reproductive history, benign breast lesions, history of hormone replacement therapy use and a more extensive evaluation of your family history.     Your 10 year risk of developing breast cancer (calculated at 69 y.o.): 15.4 %  Average women?s risk: 3.6 %    Your lifetime risk of developing breast cancer: 33.3 %  Average women?s risk: 8.7 %         Breast Cancer Screening Recommendations:    In addition to a clinical breast exam every 6 months, our screening imaging recommendations are:  Mammogram: 12 months   Include tomosynthesis (3D) if available?  yes    Breast MRI: 12 months Screening: Based on her lifetime risk of breast cancer estimated at over 20%, we recommend the following based on NCCN screening guidelines: Clinical exam with clinical breast exam every 6-12 months and annual screening mammogram with tomosynthesis. We also recommend annual breast MRI. We discussed the risks and benefits of breast MRI including increased sensitivity but also increase risk for finding benign disease, increased  potential for biopsy and financial out of pocket burden. Counseled on breast awareness including concerning changes of palpating a breast or axillary mass, nipple inversion, nipple discharge or skin changes which should prompt evaluation.           Prevention recommendations:    Maintain a healthy body weight.    Exercise regularly.  Alcohol in moderation (less than 1 drink per day).         1. History of ADH (2015) and FH of breast cancer (sister and MGAunt).   2. Genetic testing. 09/17/2019 [facility] Multi-Cancer Panel Test: no mutation. VUS; BARD1 c.542A>G (p.Glu181Gly).10/22/2024 Verification of status of VUS through [employer]. Remains variant of uncertain significance.  3. Short term risk: Patient did try raloxifene  for approximately 6 weeks; discontinued due to severe side effects of hot flashes.   4. Long term risk: Alternating breast imaging every 6 months (mammogram (TOMO) MRI due to lifetime risk > 20%) with clinical breast examination.   5. RTC 05/13/2025 with bilateral mammogram and then 10/2025 with MRI.   Continue close surveillance until age 31 and then annual mammogram.     Total Time Today was 40 minutes in the following activities: Preparing to see the patient, Obtaining and/or reviewing separately obtained history, Performing a medically appropriate examination and/or evaluation, Counseling and educating the patient/family/caregiver, Ordering medications, tests, or procedures, Referring and communication with other health care professionals (when not separately reported), Documenting clinical information in the electronic or other health record, Independently interpreting results (not separately reported) and communicating results to the patient/family/caregiver, and Care coordination (not separately reported)      Katheryn Pollard, APRN-BC, CBCN  Nurse Practitioner  Collaborating physician: Arlean Blumenthal, MD  NPI # [id number]      Katheryn KATHEE Pollard, APRN-NP

## 2024-10-22 ENCOUNTER — Encounter: Admit: 2024-10-22 | Discharge: 2024-10-22 | Payer: MEDICARE

## 2024-10-22 VITALS — BP 139/72 | HR 57 | Temp 97.80000°F | Resp 14 | Wt 214.2 lb

## 2024-10-22 DIAGNOSIS — Z1239 Encounter for other screening for malignant neoplasm of breast: Secondary | ICD-10-CM

## 2024-10-22 DIAGNOSIS — N6091 Unspecified benign mammary dysplasia of right breast: Principal | ICD-10-CM

## 2024-10-22 DIAGNOSIS — Z1231 Encounter for screening mammogram for malignant neoplasm of breast: Secondary | ICD-10-CM

## 2024-10-22 DIAGNOSIS — Z9189 Other specified personal risk factors, not elsewhere classified: Secondary | ICD-10-CM

## 2024-10-22 DIAGNOSIS — Z803 Family history of malignant neoplasm of breast: Secondary | ICD-10-CM

## 2024-10-22 DIAGNOSIS — E559 Vitamin D deficiency, unspecified: Secondary | ICD-10-CM
